# Patient Record
Sex: Female | Born: 1975 | Race: Black or African American | Hispanic: No | Smoking: Former smoker
Health system: Southern US, Community
[De-identification: ages and names within clinical notes are randomized; demographics above are authoritative.]

## PROBLEM LIST (undated history)

## (undated) DIAGNOSIS — F419 Anxiety disorder, unspecified: Secondary | ICD-10-CM

## (undated) DIAGNOSIS — D649 Anemia, unspecified: Secondary | ICD-10-CM

## (undated) DIAGNOSIS — F32A Depression, unspecified: Secondary | ICD-10-CM

## (undated) DIAGNOSIS — K219 Gastro-esophageal reflux disease without esophagitis: Secondary | ICD-10-CM

## (undated) DIAGNOSIS — E119 Type 2 diabetes mellitus without complications: Secondary | ICD-10-CM

## (undated) HISTORY — DX: Depression, unspecified: F32.A

## (undated) HISTORY — DX: Anemia, unspecified: D64.9

## (undated) HISTORY — DX: Anxiety disorder, unspecified: F41.9

## (undated) HISTORY — PX: KNEE ARTHROSCOPY WITH ANTERIOR CRUCIATE LIGAMENT (ACL) REPAIR: SHX5644

## (undated) HISTORY — DX: Gastro-esophageal reflux disease without esophagitis: K21.9

---

## 2002-11-23 DIAGNOSIS — E139 Other specified diabetes mellitus without complications: Secondary | ICD-10-CM | POA: Insufficient documentation

## 2020-02-21 ENCOUNTER — Emergency Department: Payer: No Typology Code available for payment source

## 2020-02-21 ENCOUNTER — Emergency Department
Admission: EM | Admit: 2020-02-21 | Discharge: 2020-02-21 | Disposition: A | Discharge status: Home / Self Care | Payer: No Typology Code available for payment source | Attending: Emergency Medicine | Admitting: Emergency Medicine

## 2020-02-21 ENCOUNTER — Other Ambulatory Visit: Payer: Self-pay

## 2020-02-21 DIAGNOSIS — E119 Type 2 diabetes mellitus without complications: Secondary | ICD-10-CM | POA: Diagnosis not present

## 2020-02-21 DIAGNOSIS — Y999 Unspecified external cause status: Secondary | ICD-10-CM | POA: Insufficient documentation

## 2020-02-21 DIAGNOSIS — S161XXA Strain of muscle, fascia and tendon at neck level, initial encounter: Secondary | ICD-10-CM | POA: Diagnosis not present

## 2020-02-21 DIAGNOSIS — Y93I9 Activity, other involving external motion: Secondary | ICD-10-CM | POA: Insufficient documentation

## 2020-02-21 DIAGNOSIS — S22089A Unspecified fracture of T11-T12 vertebra, initial encounter for closed fracture: Secondary | ICD-10-CM | POA: Insufficient documentation

## 2020-02-21 DIAGNOSIS — F17210 Nicotine dependence, cigarettes, uncomplicated: Secondary | ICD-10-CM | POA: Diagnosis not present

## 2020-02-21 DIAGNOSIS — S299XXA Unspecified injury of thorax, initial encounter: Secondary | ICD-10-CM | POA: Diagnosis present

## 2020-02-21 DIAGNOSIS — Y92410 Unspecified street and highway as the place of occurrence of the external cause: Secondary | ICD-10-CM | POA: Diagnosis not present

## 2020-02-21 DIAGNOSIS — S22009A Unspecified fracture of unspecified thoracic vertebra, initial encounter for closed fracture: Secondary | ICD-10-CM

## 2020-02-21 HISTORY — DX: Type 2 diabetes mellitus without complications: E11.9

## 2020-02-21 MED ORDER — IBUPROFEN 600 MG PO TABS: 600.0000 mg | ORAL_TABLET | Freq: Three times a day (TID) | ORAL | 0 refills | Status: DC | PRN

## 2020-02-21 MED ORDER — CYCLOBENZAPRINE HCL 10 MG PO TABS: 10.0000 mg | ORAL_TABLET | Freq: Three times a day (TID) | ORAL | 0 refills | Status: DC | PRN

## 2020-02-21 MED ORDER — TRAMADOL HCL 50 MG PO TABS: 50.0000 mg | ORAL_TABLET | Freq: Four times a day (QID) | ORAL | 0 refills | Status: AC | PRN
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
## 2020-02-21 NOTE — Discharge Instructions (Signed)
Follow discharge care instruction and follow-up orthopedic choice for definitive evaluation of directed fracture.  Be advised medication may cause drowsiness.

## 2020-02-21 NOTE — ED Notes (Signed)
POC urine pregnancy negative at this time.

## 2020-02-22 LAB — POCT PREGNANCY, URINE: Preg Test, Ur: NEGATIVE

## 2020-04-20 ENCOUNTER — Other Ambulatory Visit: Payer: Self-pay

## 2020-04-20 DIAGNOSIS — F17210 Nicotine dependence, cigarettes, uncomplicated: Secondary | ICD-10-CM | POA: Insufficient documentation

## 2020-04-20 DIAGNOSIS — L723 Sebaceous cyst: Secondary | ICD-10-CM | POA: Insufficient documentation

## 2020-04-20 DIAGNOSIS — E119 Type 2 diabetes mellitus without complications: Secondary | ICD-10-CM | POA: Insufficient documentation

## 2020-04-20 NOTE — ED Triage Notes (Signed)
Pt presents to ED with painful large knot under her left breast. Pt states she noticed a very small knot in the affected area over a year ago but it did not hurt and did not change until approx 2-3 days ago. Pt states she noticed that suddenly it was much larger and very painful. Denies drainage.

## 2020-04-21 ENCOUNTER — Emergency Department: Payer: Self-pay

## 2020-04-21 ENCOUNTER — Encounter: Payer: Self-pay | Admitting: Emergency Medicine

## 2020-04-21 ENCOUNTER — Emergency Department
Admission: EM | Admit: 2020-04-21 | Discharge: 2020-04-21 | Disposition: A | Discharge status: Home / Self Care | Payer: Self-pay | Attending: Emergency Medicine | Admitting: Emergency Medicine

## 2020-04-21 DIAGNOSIS — R52 Pain, unspecified: Secondary | ICD-10-CM

## 2020-04-21 DIAGNOSIS — R609 Edema, unspecified: Secondary | ICD-10-CM

## 2020-04-21 DIAGNOSIS — L089 Local infection of the skin and subcutaneous tissue, unspecified: Secondary | ICD-10-CM

## 2020-04-21 MED ORDER — SULFAMETHOXAZOLE-TRIMETHOPRIM 800-160 MG PO TABS
1.0000 | ORAL_TABLET | Freq: Two times a day (BID) | ORAL | 0 refills | Status: AC
Start: 2020-04-21 — End: 2020-05-01

## 2020-04-21 MED ORDER — OXYCODONE-ACETAMINOPHEN 5-325 MG PO TABS: 1.0000 | ORAL_TABLET | ORAL | 0 refills | Status: AC | PRN

## 2020-04-21 MED ORDER — SULFAMETHOXAZOLE-TRIMETHOPRIM 800-160 MG PO TABS
1.0000 | ORAL_TABLET | Freq: Once | ORAL | Status: AC
  Administered 2020-04-21: 1 via ORAL
  Filled 2020-04-21: qty 1

## 2020-04-21 NOTE — ED Provider Notes (Signed)
[facility] Emergency Department Provider Note  ____________________________________________   First MD Initiated Contact with Patient 04/21/20 [phone]     (approximate)  I have reviewed the triage vital signs and the nursing notes.   HISTORY  Chief Complaint Cyst    HPI Wendy Colon is a 44 y.o. female history diabetes mellitus presents to the emergency department secondary to a painful large knot under her left breast.  Patient states that she had a small knot in that area for well over a year that was never painful however for the past 2 to 3 days area has increased in size and is markedly painful with a current pain score of 9 out of 10.  Patient states that the pain is worse with palpation of the area.  Patient denies any fever afebrile on presentation.        Past Medical History:  Diagnosis Date  . Diabetes mellitus without complication ([place])     There are no problems to display for this patient.   History reviewed. No pertinent surgical history.  Prior to Admission medications   Medication Sig Start Date End Date Taking? Authorizing Provider  cyclobenzaprine (FLEXERIL) 10 MG tablet Take 1 tablet (10 mg total) by mouth 3 (three) times daily as needed. 02/21/20   Sable Feil, PA-C  ibuprofen (ADVIL) 600 MG tablet Take 1 tablet (600 mg total) by mouth every 8 (eight) hours as needed. 02/21/20   Sable Feil, PA-C  oxyCODONE-acetaminophen (PERCOCET) 5-325 MG tablet Take 1 tablet by mouth every 4 (four) hours as needed for severe pain. 04/21/20 04/21/21  Gregor Hams, MD  sulfamethoxazole-trimethoprim (BACTRIM DS) 800-160 MG tablet Take 1 tablet by mouth 2 (two) times daily for 10 days. 04/21/20 05/01/20  Gregor Hams, MD  traMADol (ULTRAM) 50 MG tablet Take 1 tablet (50 mg total) by mouth every 6 (six) hours as needed. 02/21/20 02/20/21  Sable Feil, PA-C    Allergies Patient has no known allergies.  No family history on  file.  Social History Social History   Tobacco Use  . Smoking status: Current Every Day Smoker    Types: Cigarettes  . Smokeless tobacco: Never Used  Vaping Use  . Vaping Use: Never used  Substance Use Topics  . Alcohol use: Yes    Comment: ocassional  . Drug use: Never    Review of Systems Constitutional: No fever/chills Eyes: No visual changes. ENT: No sore throat. Cardiovascular: Denies chest pain. Respiratory: Denies shortness of breath. Gastrointestinal: No abdominal pain.  No nausea, no vomiting.  No diarrhea.  No constipation. Genitourinary: Negative for dysuria. Musculoskeletal: Negative for neck pain.  Negative for back pain. Integumentary: As it is for swollen tender area just inferior to the left breast Neurological: Negative for headaches, focal weakness or numbness.  ____________________________________________   PHYSICAL EXAM:  VITAL SIGNS: ED Triage Vitals  Enc Vitals Group     BP 04/21/20 0002 (!) 144/78     Pulse Rate 04/21/20 0002 (!) 103     Resp 04/21/20 0002 18     Temp 04/21/20 0002 97.6 F (36.4 C)     Temp Source 04/21/20 0002 Oral     SpO2 04/21/20 0002 100 %     Weight 04/21/20 0005 102.1 kg (225 lb)     Height 04/21/20 0005 1.626 m (5\' 4" )     Head Circumference --      Peak Flow --      Pain Score 04/21/20  0004 9     Pain Loc --      Pain Edu? --      Excl. in [place]? --     Constitutional: Alert and oriented.  Eyes: Conjunctivae are normal.  Head: Atraumatic. Mouth/Throat: Patient is wearing a mask. Neck: No stridor.  No meningeal signs.   Chest: Golf ball size area of induration tender to palpation just inferior to the left breast without any overlying skin changes Cardiovascular: Normal rate, regular rhythm. Good peripheral circulation. Grossly normal heart sounds. Respiratory: Normal respiratory effort.  No retractions. Gastrointestinal: Soft and nontender. No distention.  Musculoskeletal: No lower extremity tenderness nor  edema. No gross deformities of extremities. Neurologic:  Normal speech and language. No gross focal neurologic deficits are appreciated.  Skin: Refer to chest exam Psychiatric: Mood and affect are normal. Speech and behavior are normal.  CLINICAL DATA:  Painful nodule along the anterior chest wall inferior to the left breast.  EXAM: ULTRASOUND OF SOFT TISSUES  TECHNIQUE: Ultrasound examination of the head and neck soft tissues was performed in the area of clinical concern in the anterior chest wall soft tissues inferior to the left breast  COMPARISON:  None.  FINDINGS: Corresponding well to the area of palpable abnormality is a heterogeneous though predominantly hypoechoic structure measuring 2.0 x 0.8 x 1.8 cm. This does appear contiguous with the superficial dermal layer. No sinus tract to the skin surface is visualized. Some more anechoic central components are present as well which could reflect fluid within this collection. Posterior acoustic enhancement is noted. Suspect some mild surrounding edematous change with increased vascularity along the periphery which could reflect inflammation. A more crescentic hypoattenuating area versus possible septation seen posteriorly, possibly rupture versus loculation. No concerning internal color Doppler flow.  IMPRESSION: Features are most suggestive of a an epidermal inclusion cyst. A hypoattenuating crescentic area posteriorly could reflect cyst rupture or a septate loculation. Surrounding hyperemic color flow could also suggest more acute inflammation or infection. Consider repeat sonographic imaging or MRI assessment if this structure fails to regress or continues to enlarge following therapeutic intervention.   Electronically Signed   By: Lovena Le M.D.   On: 04/21/2020 06:50 Procedures   ____________________________________________   INITIAL IMPRESSION / MDM / [place] / ED COURSE  As part of  my medical decision making, I reviewed the following data within the electronic MEDICAL RECORD NUMBER  44 year old female presented with above-stated history and physical exam concerning for an infected sebaceous cyst, abscess, tumor.  Soft tissue ultrasound was performed which revealed findings most suggestive of a epidermal cyst.  Patient was given Bactrim DS in the emergency department will be prescribed the same for home.  Patient also prescribed Percocet and advised not to drive or operate machinery while taking Percocet.  Patient referred to Dr. Dahlia Byes general surgery for further outpatient evaluation and management. ____________________________________________  FINAL CLINICAL IMPRESSION(S) / ED DIAGNOSES  Final diagnoses:  Swelling  Pain  Infected sebaceous cyst     MEDICATIONS GIVEN DURING THIS VISIT:  Medications  sulfamethoxazole-trimethoprim (BACTRIM DS) 800-160 MG per tablet 1 tablet (1 tablet Oral Given 04/21/20 0736)     ED Discharge Orders         Ordered    sulfamethoxazole-trimethoprim (BACTRIM DS) 800-160 MG tablet  2 times daily     Discontinue  Reprint     04/21/20 0708    oxyCODONE-acetaminophen (PERCOCET) 5-325 MG tablet  Every 4 hours PRN     Discontinue  Reprint     04/21/20 0708          *Please note:  Harini Dearmond was evaluated in Emergency Department on 04/22/2020 for the symptoms described in the history of present illness. She was evaluated in the context of the global COVID-19 pandemic, which necessitated consideration that the patient might be at risk for infection with the SARS-CoV-2 virus that causes COVID-19. Institutional protocols and algorithms that pertain to the evaluation of patients at risk for COVID-19 are in a state of rapid change based on information released by regulatory bodies including the CDC and federal and state organizations. These policies and algorithms were followed during the patient's care in the ED.  Some ED evaluations and  interventions may be delayed as a result of limited staffing during and after the pandemic.*  Note:  This document was prepared using Dragon voice recognition software and may include unintentional dictation errors.   Gregor Hams, MD 04/22/20 [phone]

## 2020-04-21 NOTE — ED Notes (Signed)
Pt denies any needs at this time. Lights dimmed and call bell in reach.

## 2020-04-23 ENCOUNTER — Telehealth: Payer: Self-pay | Admitting: Surgery

## 2020-04-23 NOTE — Telephone Encounter (Signed)
Outbound call made to pt & msg left requesting a call back to schedule an ED follow up appt for an infected sebaceous cyst w/Dr. Dahlia Byes in 2 wks from 04/20/20.  When the pt returns the call, please schedule.  Thank you

## 2020-04-26 NOTE — Telephone Encounter (Signed)
Outbound call made to the pt who answered & asked to call right back.  This attempt was made around 11:30 am & thus far no returned call has been recv'd or documented.  Thank you

## 2020-04-28 NOTE — Telephone Encounter (Signed)
Outbound call made & appt finally scheduled for 05/24/20.  The pt's work schedule did not allow for any date prior to this.

## 2020-05-24 ENCOUNTER — Ambulatory Visit: Payer: Self-pay | Admitting: Surgery

## 2020-05-31 ENCOUNTER — Encounter: Payer: Self-pay | Admitting: Surgery

## 2020-05-31 ENCOUNTER — Other Ambulatory Visit: Payer: Self-pay

## 2020-05-31 ENCOUNTER — Ambulatory Visit (INDEPENDENT_AMBULATORY_CARE_PROVIDER_SITE_OTHER): Payer: Self-pay | Admitting: Surgery

## 2020-05-31 VITALS — BP 126/78 | HR 89 | Temp 99.8°F | Ht 64.0 in | Wt 222.8 lb

## 2020-05-31 DIAGNOSIS — N6002 Solitary cyst of left breast: Secondary | ICD-10-CM

## 2020-05-31 MED ORDER — FLUCONAZOLE 200 MG PO TABS: 200.0000 mg | ORAL_TABLET | Freq: Every day | ORAL | 0 refills | Status: DC

## 2020-05-31 MED ORDER — AMOXICILLIN-POT CLAVULANATE 875-125 MG PO TABS
1.0000 | ORAL_TABLET | Freq: Two times a day (BID) | ORAL | 0 refills | Status: AC
Start: 2020-05-31 — End: 2020-06-07

## 2020-05-31 NOTE — Patient Instructions (Addendum)
Dr.Pabon suggested patient to have an Mammogram and Ultrasound of Left Breast. Patient will follow up in two weeks with Dr.Pabon. Patient will contact our office when to help scheduling with new job.   Breast Cyst  A breast cyst is a sac in the breast that is filled with fluid. They are usually noncancerous (benign) and are common among women. Breast cysts are most often in the upper, outer portion of the breast. One or more cysts may develop. They form when fluid builds up inside the breast glands. There are several types of breast cysts. Some are too small to feel, but these can be seen with imaging tests such as an X-ray of the breast (mammogram) or ultrasound. Breast cysts do not increase your risk of breast cancer. They usually disappear after you no longer have a menstrual cycle (after menopause), unless you take artificial hormones (are on hormone therapy). What are the causes? This condition may be caused by:  Blockage of tubes (ducts) in the breast glands, which leads to fluid buildup. Duct blockage may result from: ? Fibrocystic breast changes. This is a common, benign condition that occurs when women go through hormonal changes during the menstrual cycle. This is a common cause of multiple breast cysts. ? Overgrowth of breast tissue or breast glands. ? Scar tissue in the breast from previous surgery.  Changes in certain female hormones (estrogen and progesterone). The exact cause of this condition is not known. What increases the risk? You may be more likely to develop breast cysts if you have not gone through menopause. What are the signs or symptoms? Symptoms of this condition include:  Feeling one or more smooth, round, soft lumps (like grapes) in the breast that are easily movable. The lump or lumps may get bigger and more painful before your menstrual period and get smaller after your menstrual period.  Breast discomfort or pain. How is this diagnosed? This condition may  be diagnosed based on:  A physical exam. A cyst can be felt by your health care provider during this exam.  Imaging tests, such as mammogram or ultrasound. Fluid may be removed from the cyst with a needle (fine-needle aspiration) and tested to make sure the cyst is not cancerous. How is this treated? Treatment may not be needed for this condition. Your health care provider may monitor the cyst to see if it goes away on its own. If the cyst is uncomfortable or gets bigger, or if you do not like how the cyst makes your breast look, you may need treatment. Treatment may include:  Hormone therapy.  Fine-needle aspiration to drain fluid from the cyst. There is a chance of the cyst coming back (recurring) after aspiration.  Surgery to remove the cyst. Follow these instructions at home: Self-exams   Do a breast self-exam every month, or as often as directed. A breast self-exam involves: ? Comparing your breasts in the mirror. ? Looking for visible changes in your skin or nipples. ? Feeling for lumps or changes.  Having many breast cysts may make it harder to feel for new lumps. Understand how your breasts normally look and feel, and write down any changes in your breasts. Tell your health care provider about any changes. Eating and drinking  Follow instructions from your health care provider about eating and drinking restrictions.  Drink enough fluid to keep your urine pale yellow.  Avoid caffeine.  Cut down on salt (sodium) in what you eat and drink, especially before your menstrual period.  Too much sodium can cause fluid buildup, breast swelling, and discomfort. General instructions  See your health care provider regularly. ? Get a yearly physical exam. ? If you are 32-32 years old, get a clinical breast exam every 1-3 years. After the age of 10 years, get this exam every year. ? Get mammograms as often as directed.  Take over-the-counter and prescription medicines only as told by  your health care provider.  Wear a supportive bra, especially when exercising.  Keep all follow-up visits as told by your health care provider. This is important. Contact a health care provider if:  You feel, or think you feel, a lump in your breast.  You notice that both breasts look or feel different than usual.  Your breast is still causing pain after your menstrual period is over.  You find new lumps or bumps that were not there before.  You feel lumps in your armpit. Get help right away if:  You have severe pain, tenderness, redness, or warmth in your breast.  You have fluid or blood leaking from your nipple.  Your breast lump becomes hard and painful.  You notice dimpling or wrinkling of the breast or nipple. Summary  A breast cyst is a sac in the breast that is filled with fluid.  Treatment may not be needed for this condition.  If the cyst is uncomfortable or gets bigger, or if you do not like how the cyst makes your breast look, you may need treatment. This information is not intended to replace advice given to you by your health care provider. Make sure you discuss any questions you have with your health care provider. Document Revised: 03/04/2019 Document Reviewed: 03/04/2019 Elsevier Patient Education  [place].

## 2020-06-02 NOTE — Progress Notes (Signed)
Patient ID: Wendy Colon, female   DOB: 1976-02-19, 44 y.o.   MRN: [id number]  HPI Wendy Colon is a 44 y.o. female in consultation at the request of Wendy Colon.  She reports that her last month and a half or so she has had causes significant pain.  She ports that the pain is intermittent mild to moderate intensity and sharp in nature.  Worsening with tight breast and when she presses on the left breast.  No fevers no chills.  She went to the emergency room about 6 weeks ago and at that time an ultrasound was obtained that I have personally reviewed showing evidence of a 2 cm lesions consistent with epidermal cyst on the left breast. She was given Bactrim and she had significant improvement.  Denies any fevers any chills.  No family history of breast cancer.  She had a mammogram before about 2 years ago at [facility].  Per her report there was no evidence of any concerning lesions.  Her CBC shows anemia with a hemoglobin of 10.7 and a sugar of 278    HPI  Past Medical History:  Diagnosis Date  . Diabetes mellitus without complication ([place])     History reviewed. No pertinent surgical history.  Family History  Problem Relation Age of Onset  . Diabetes Mother   . Hypertension Mother   . Diabetes Father   . Hypertension Father   . Diabetes Sister   . Diabetes Sister   . Diabetes Sister     Social History Social History   Tobacco Use  . Smoking status: Current Every Day Smoker    Types: Cigarettes  . Smokeless tobacco: Never Used  Vaping Use  . Vaping Use: Never used  Substance Use Topics  . Alcohol use: Yes    Comment: ocassional  . Drug use: Never    No Known Allergies  Current Outpatient Medications  Medication Sig Dispense Refill  . buPROPion (WELLBUTRIN XL) 150 MG 24 hr tablet Take 150 mg by mouth every morning.    Marland Kitchen DEXILANT 30 MG capsule Take 1 capsule by mouth daily.    Marland Kitchen glimepiride (AMARYL) 2 MG tablet Take 2 mg by mouth daily.    Marland Kitchen ibuprofen (ADVIL) 600  MG tablet Take 1 tablet (600 mg total) by mouth every 8 (eight) hours as needed. 15 tablet 0  . amoxicillin-clavulanate (AUGMENTIN) 875-125 MG tablet Take 1 tablet by mouth 2 (two) times daily for 7 days. 14 tablet 0  . cyclobenzaprine (FLEXERIL) 10 MG tablet Take 1 tablet (10 mg total) by mouth 3 (three) times daily as needed. (Patient not taking: Reported on 05/31/2020) 15 tablet 0  . fluconazole (DIFLUCAN) 200 MG tablet Take 1 tablet (200 mg total) by mouth daily. 2 tablet 0  . oxyCODONE-acetaminophen (PERCOCET) 5-325 MG tablet Take 1 tablet by mouth every 4 (four) hours as needed for severe pain. (Patient not taking: Reported on 05/31/2020) 20 tablet 0  . traMADol (ULTRAM) 50 MG tablet Take 1 tablet (50 mg total) by mouth every 6 (six) hours as needed. (Patient not taking: Reported on 05/31/2020) 20 tablet 0   No current facility-administered medications for this visit.     Review of Systems Full ROS  was asked and was negative except for the information on the HPI  Physical Exam Blood pressure 126/78, pulse 89, temperature 99.8 F (37.7 C), temperature source Oral, height 5\' 4"  (1.626 m), weight 222 lb 12.8 oz (101.1 kg), SpO2 96 %. CONSTITUTIONAL: NAD EYES: Pupils  are equal, round, and reactive to light, Sclera are non-icteric. EARS, NOSE, MOUTH AND THROAT: She is wearing a mask hearing is intact to voice. LYMPH NODES:  Lymph nodes in the neck are normal. RESPIRATORY:  Lungs are clear. There is normal respiratory effort, with equal breath sounds bilaterally, and without pathologic use of accessory muscles. CARDIOVASCULAR: Heart is regular without murmurs, gallops, or rubs. BREAST: There is evidence of tenderness palpation on the left breast.  At the inframammary fold located approximately 6:00.  There is some induration but is difficult to determine whether or not this is a cyst or mass.  Exam is limited due to tenderness. GI: The abdomen is  soft, nontender, and nondistended. There are no  palpable masses. There is no hepatosplenomegaly. There are normal bowel sounds in all quadrants. GU: Rectal deferred.   MUSCULOSKELETAL: Normal muscle strength and tone. No cyanosis or edema.   SKIN: Turgor is good and there are no pathologic skin lesions or ulcers. NEUROLOGIC: Motor and sensation is grossly normal. Cranial nerves are grossly intact. PSYCH:  Oriented to person, place and time. Affect is normal.  Data Reviewed  I have personally reviewed the patient's imaging, laboratory findings and medical records.    Assessment/Plan 44 year old female with benign left breast epidermal inclusion cyst.  Cussed with the patient in detail given that the ultrasound was done about 6 weeks ago I do think before proceeding with any surgical intervention I would like to obtain a formal mammogram as well as an ultrasound to complement the study.  She will likely benefit from excisional biopsy of the breast benign soft tissue mass.  Discussed with the patient in detail.  Currently there is no need for emergent surgical intervention at this time.  We will see her back after she completes her work-up. A copy of this report was sent to the referring provider    Caroleen Hamman, MD FACS General Surgeon 06/02/2020, 6:42 PM

## 2020-06-10 ENCOUNTER — Telehealth: Payer: Self-pay | Admitting: Emergency Medicine

## 2020-06-10 NOTE — Telephone Encounter (Signed)
Per Destiny, patient was starting a new job and wanted to wait on scheduling mammogram and ultrasound. I tried contacting patient with no answer. Left vm to call the office back to see if we can proceed with scheduling with imaging.

## 2020-06-10 NOTE — Telephone Encounter (Signed)
-----   Message from Olean Ree, MD sent at 06/10/2020  7:33 AM EDT ----- Regarding: U/S and mammogram Hi,  [place] saw this patient on 8/2 and both U/S and mammogram were ordered for her that day, but looks like they have not been scheduled yet.  Can y'all check with [place] to make sure it's not been missed?  Thanks!  Jose  ----- Message ----- From: SYSTEM Sent: 06/05/2020  12:09 AM EDT To: Jules Husbands, MD, Amb Clinical Pool

## 2020-08-10 ENCOUNTER — Ambulatory Visit: Payer: Self-pay | Admitting: Internal Medicine

## 2021-11-24 ENCOUNTER — Encounter: Payer: Self-pay | Admitting: Nurse Practitioner

## 2021-11-24 ENCOUNTER — Other Ambulatory Visit: Payer: Self-pay

## 2021-11-24 ENCOUNTER — Ambulatory Visit (INDEPENDENT_AMBULATORY_CARE_PROVIDER_SITE_OTHER): Payer: No Typology Code available for payment source | Admitting: Nurse Practitioner

## 2021-11-24 VITALS — BP 142/91 | HR 98 | Temp 98.0°F | Ht 64.0 in | Wt 216.0 lb

## 2021-11-24 DIAGNOSIS — E669 Obesity, unspecified: Secondary | ICD-10-CM | POA: Diagnosis not present

## 2021-11-24 DIAGNOSIS — Z1329 Encounter for screening for other suspected endocrine disorder: Secondary | ICD-10-CM

## 2021-11-24 DIAGNOSIS — Z794 Long term (current) use of insulin: Secondary | ICD-10-CM

## 2021-11-24 DIAGNOSIS — R5383 Other fatigue: Secondary | ICD-10-CM

## 2021-11-24 DIAGNOSIS — N926 Irregular menstruation, unspecified: Secondary | ICD-10-CM

## 2021-11-24 DIAGNOSIS — F331 Major depressive disorder, recurrent, moderate: Secondary | ICD-10-CM

## 2021-11-24 DIAGNOSIS — Z6835 Body mass index (BMI) 35.0-35.9, adult: Secondary | ICD-10-CM

## 2021-11-24 DIAGNOSIS — E1165 Type 2 diabetes mellitus with hyperglycemia: Secondary | ICD-10-CM | POA: Diagnosis not present

## 2021-11-24 DIAGNOSIS — Z13220 Encounter for screening for lipoid disorders: Secondary | ICD-10-CM

## 2021-11-24 DIAGNOSIS — Z1231 Encounter for screening mammogram for malignant neoplasm of breast: Secondary | ICD-10-CM | POA: Diagnosis not present

## 2021-11-24 MED ORDER — FREESTYLE LIBRE 14 DAY READER DEVI: 6 refills | Status: DC

## 2021-11-24 MED ORDER — LEVEMIR FLEXTOUCH 100 UNIT/ML ~~LOC~~ SOPN: 10.0000 [IU] | PEN_INJECTOR | Freq: Every day | SUBCUTANEOUS | 11 refills | Status: DC

## 2021-11-24 MED ORDER — BLOOD GLUCOSE MONITOR KIT: PACK | 0 refills | Status: DC

## 2021-11-24 MED ORDER — FREESTYLE LIBRE 3 SENSOR MISC: 1.0000 | Freq: Four times a day (QID) | 0 refills | Status: DC

## 2021-11-24 NOTE — Progress Notes (Addendum)
Subjective:  Patient ID: Wendy Colon, female    DOB: [birth year]  Age: 46 y.o. MRN: [id number]  CC:  Chief Complaint  Patient presents with   Establish Care   Diabetes   Depression      HPI  This patient arrives today for the above.  Establish care: She is here to establish care.  Main concerns today are diabetes and depression.  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  Read below for further information.  She does mention she has been having irregular periods and would like to be referred to OB/GYN.  She also tells me she is due for mammogram.  Diabetes: She reports a history of type 2 diabetes.  In her chart it states that she is type 1.5 but manages type II.  She is on insulin currently.  She tells me she takes NovoLog and use a sliding scale, however she does not have a specific scale she follows.  She just takes 2 to 10 units based on how she feels and based on her food intake.  She has taken Levemir in the past but is no longer taking this now.  She is interested in trying medication like Ozempic to help control her diabetes but also help with weight loss.  He is also on glimepiride.  She denies any recent hypoglycemic events, but does tell me these occur sometimes.  She reports that she is very sensitive to insulin and that 2 units will often drop her blood sugar from the 230s to low 100s.  Depression: She has a history of depression and anxiety and was on Wellbutrin in the past.  She tolerated this well, she is not currently on any medication but feels that she needs something for her mood.  She does believe the Wellbutrin increased anxiety slightly when she was on this in the past.  She is tried Cymbalta as well which caused sexual dysfunction.  She would prefer not to restart a medication like Cymbalta or any medication that has side effect of sexual dysfunction.  Past  Medical History:  Diagnosis Date   Anxiety 2018   Diabetes mellitus without complication ([place])    GERD (gastroesophageal reflux disease) 2004      Family History  Problem Relation Age of Onset   Diabetes Mother    Hypertension Mother    Diabetes Father    Hypertension Father    Diabetes Sister    Diabetes Sister    Diabetes Sister     Social History   Social History Narrative   Not on file   Social History   Tobacco Use   Smoking status: Every Day    Types: Cigarettes   Smokeless tobacco: Never  Substance Use Topics   Alcohol use: Yes    Comment: ocassional     Current Meds  Medication Sig   blood glucose meter kit and supplies KIT Dispense based on patient and insurance preference. Use up to four times daily as directed.   Continuous Blood Gluc Receiver (FREESTYLE LIBRE 14 DAY READER) DEVI Use to check blood sugar four times a day   Continuous Blood Gluc Sensor (FREESTYLE LIBRE 3 SENSOR) MISC 1 each by Does not apply route in the morning, at noon, in the evening, and at bedtime.   DEXILANT 30 MG capsule Take 1 capsule by mouth daily.   glimepiride (AMARYL) 2 MG  tablet Take 2 mg by mouth daily.   insulin detemir (LEVEMIR FLEXTOUCH) 100 UNIT/ML FlexPen Inject 10 Units into the skin daily.   losartan (COZAAR) 50 MG tablet Take 25 mg by mouth daily.   [DISCONTINUED] insulin aspart (NOVOLOG) 100 UNIT/ML injection Inject 2-10 Units into the skin 3 (three) times daily before meals. Sliding Scale    ROS:  Review of Systems  Constitutional:  Positive for malaise/fatigue.  Respiratory:  Negative for shortness of breath.   Cardiovascular:  Negative for chest pain.  Neurological:  Negative for loss of consciousness.  Psychiatric/Behavioral:  Positive for depression. The patient is nervous/anxious.     Objective:   Today's Vitals: BP (!) 142/91 (BP Location: Left Arm, Patient Position: Sitting, Cuff Size: Normal)    Pulse 98    Temp 98 F (36.7 C) (Oral)    Ht 5' 4"   (1.626 m)    Wt 216 lb (98 kg)    SpO2 100%    BMI 37.08 kg/m  Vitals with BMI 11/24/2021 05/31/2020 04/21/2020  Height 5' 4"  5' 4"  -  Weight 216 lbs 222 lbs 13 oz -  BMI 41.28 78.67 -  Systolic 672 094 709  Diastolic 91 78 72  Pulse 98 89 87     Physical Exam Vitals reviewed.  Constitutional:      General: She is not in acute distress.    Appearance: Normal appearance.  HENT:     Head: Normocephalic and atraumatic.  Neck:     Vascular: No carotid bruit.  Cardiovascular:     Rate and Rhythm: Normal rate and regular rhythm.     Pulses: Normal pulses.     Heart sounds: Normal heart sounds.  Pulmonary:     Effort: Pulmonary effort is normal.     Breath sounds: Normal breath sounds.  Skin:    General: Skin is warm and dry.  Neurological:     General: No focal deficit present.     Mental Status: She is alert and oriented to person, place, and time.  Psychiatric:        Mood and Affect: Mood normal.        Behavior: Behavior normal.        Judgment: Judgment normal.         Assessment and Plan   1. Type 2 diabetes mellitus with hyperglycemia, with long-term current use of insulin ([place])   2. Irregular periods   3. Encounter for screening mammogram for malignant neoplasm of breast   4. Class 2 obesity without serious comorbidity with body mass index (BMI) of 35.0 to 35.9 in adult, unspecified obesity type   5. Screening for lipid disorders   6. Screening for thyroid disorder   7. Fatigue, unspecified type   8. Moderate episode of recurrent major depressive disorder ([place])      Plan: 1.,  4.-7.  We will check blood work for further evaluation today.  We will start her on 10 units of Levemir that she will take once a day.  Also recommend she check her fasting blood sugar every day and keep a log of this.  We did discusshow to titrate up on her Levemir based on her fasting blood sugars at home.  She will follow-up with me in 2 weeks so we can monitor this closely.  We will  also refer to endocrinology for further evaluation and assistance with managing her diabetes to determine if she is type 1.5 or type II.  We will check A1c  today and check urine for albuminuria.  She is currently on losartan, and I encouraged her to continue taking this.  She does mention it sometimes makes her dizzy so we will discuss this further at next office visit and may adjust the dose. 2.  We will refer to OB/GYN for further evaluation of her irregular periods. 3.  We will order screening mammogram. 8.  We did discuss different treatment options.  For now because she wants to avoid medications that can cause sexual dysfunction we will recommend trying Wellbutrin again and may be adding BuSpar that she can take as needed for anxiety.  She is agreeable to this plan, will consider ordering these prescriptions once we get blood work back tomorrow.   Tests ordered Orders Placed This Encounter  Procedures   MM Digital Screening   TSH   Hemoglobin A1c   Lipid panel   Comprehensive metabolic panel   CBC with Differential/Platelet   T3, free   T4, free   Microalbumin / creatinine urine ratio   Ambulatory referral to Endocrinology   Ambulatory referral to Obstetrics / Gynecology      Meds ordered this encounter  Medications   blood glucose meter kit and supplies KIT    Sig: Dispense based on patient and insurance preference. Use up to four times daily as directed.    Dispense:  1 each    Refill:  0    Order Specific Question:   Supervising Provider    Answer:   Binnie Rail [[id number]]    Order Specific Question:   Number of strips    Answer:   100    Order Specific Question:   Number of lancets    Answer:   100   Continuous Blood Gluc Receiver (FREESTYLE LIBRE 14 DAY READER) DEVI    Sig: Use to check blood sugar four times a day    Dispense:  1 each    Refill:  6    Order Specific Question:   Supervising Provider    Answer:   BURNS, Claudina Lick [[id number]]   Continuous Blood Gluc  Sensor (FREESTYLE LIBRE 3 SENSOR) MISC    Sig: 1 each by Does not apply route in the morning, at noon, in the evening, and at bedtime.    Dispense:  1 each    Refill:  0    Order Specific Question:   Supervising Provider    Answer:   BURNS, Claudina Lick [[id number]]   insulin detemir (LEVEMIR FLEXTOUCH) 100 UNIT/ML FlexPen    Sig: Inject 10 Units into the skin daily.    Dispense:  15 mL    Refill:  11    Order Specific Question:   Supervising Provider    Answer:   Binnie Rail [[id number]]    Patient to follow-up in 2 weeks or sooner as needed.  Ailene Ards, NP

## 2021-11-24 NOTE — Patient Instructions (Signed)
Check fasting blood sugar every morning, if fasting blood sugar is >130 over the next 3 days then increase your levemir to 12 units/day. Take that dose for 3 days, if after 3 days your fasting blood sugar is still >130 increase your levemir to 14 units/day. Reach out to me if your fasting blood sugar remains >250 despite being on 14 units of levemir.

## 2021-11-25 ENCOUNTER — Other Ambulatory Visit (INDEPENDENT_AMBULATORY_CARE_PROVIDER_SITE_OTHER): Payer: No Typology Code available for payment source

## 2021-11-25 DIAGNOSIS — Z1231 Encounter for screening mammogram for malignant neoplasm of breast: Secondary | ICD-10-CM | POA: Diagnosis not present

## 2021-11-25 DIAGNOSIS — R5383 Other fatigue: Secondary | ICD-10-CM

## 2021-11-25 DIAGNOSIS — N926 Irregular menstruation, unspecified: Secondary | ICD-10-CM

## 2021-11-25 DIAGNOSIS — E1165 Type 2 diabetes mellitus with hyperglycemia: Secondary | ICD-10-CM

## 2021-11-25 DIAGNOSIS — Z6835 Body mass index (BMI) 35.0-35.9, adult: Secondary | ICD-10-CM | POA: Diagnosis not present

## 2021-11-25 DIAGNOSIS — Z13220 Encounter for screening for lipoid disorders: Secondary | ICD-10-CM

## 2021-11-25 DIAGNOSIS — Z1329 Encounter for screening for other suspected endocrine disorder: Secondary | ICD-10-CM

## 2021-11-25 DIAGNOSIS — E669 Obesity, unspecified: Secondary | ICD-10-CM

## 2021-11-25 DIAGNOSIS — Z794 Long term (current) use of insulin: Secondary | ICD-10-CM

## 2021-11-25 LAB — COMPREHENSIVE METABOLIC PANEL
ALT: 14 U/L (ref 0–35)
AST: 17 U/L (ref 0–37)
Albumin: 4.5 g/dL (ref 3.5–5.2)
Alkaline Phosphatase: 67 U/L (ref 39–117)
BUN: 12 mg/dL (ref 6–23)
CO2: 27 mEq/L (ref 19–32)
Calcium: 9.7 mg/dL (ref 8.4–10.5)
Chloride: 104 mEq/L (ref 96–112)
Creatinine, Ser: 0.96 mg/dL (ref 0.40–1.20)
GFR: 70.67 mL/min (ref >60.00)
Glucose, Bld: 134 mg/dL — ABNORMAL HIGH (ref 70–99)
Potassium: 4.1 mEq/L (ref 3.5–5.1)
Sodium: 137 mEq/L (ref 135–145)
Total Bilirubin: 0.2 mg/dL (ref 0.2–1.2)
Total Protein: 7.7 g/dL (ref 6.0–8.3)

## 2021-11-25 LAB — CBC WITH DIFFERENTIAL/PLATELET
Basophils Absolute: 0.1 10*3/uL (ref 0.0–0.1)
Basophils Relative: 1.4 % (ref 0.0–3.0)
Eosinophils Absolute: 0.1 10*3/uL (ref 0.0–0.7)
Eosinophils Relative: 1.3 % (ref 0.0–5.0)
HCT: 34.8 % — ABNORMAL LOW (ref 36.0–46.0)
Hemoglobin: 11.2 g/dL — ABNORMAL LOW (ref 12.0–15.0)
Lymphocytes Relative: 28 % (ref 12.0–46.0)
Lymphs Abs: 2.3 10*3/uL (ref 0.7–4.0)
MCHC: 32.2 g/dL (ref 30.0–36.0)
MCV: 80.9 fl (ref 78.0–100.0)
Monocytes Absolute: 0.6 10*3/uL (ref 0.1–1.0)
Monocytes Relative: 7.5 % (ref 3.0–12.0)
Neutro Abs: 5 10*3/uL (ref 1.4–7.7)
Neutrophils Relative %: 61.8 % (ref 43.0–77.0)
Platelets: 315 10*3/uL (ref 150.0–400.0)
RBC: 4.3 Mil/uL (ref 3.87–5.11)
RDW: 19.6 % — ABNORMAL HIGH (ref 11.5–15.5)
WBC: 8.1 10*3/uL (ref 4.0–10.5)

## 2021-11-25 LAB — LIPID PANEL
Cholesterol: 166 mg/dL (ref 0–200)
HDL: 69.2 mg/dL (ref >39.00)
LDL Cholesterol: 84 mg/dL (ref 0–99)
NonHDL: 97.26
Total CHOL/HDL Ratio: 2
Triglycerides: 64 mg/dL (ref 0.0–149.0)
VLDL: 12.8 mg/dL (ref 0.0–40.0)

## 2021-11-25 LAB — TSH: TSH: 1.25 u[IU]/mL (ref 0.35–5.50)

## 2021-11-25 LAB — MICROALBUMIN / CREATININE URINE RATIO
Creatinine,U: 114.6 mg/dL
Microalb Creat Ratio: 2.9 mg/g (ref 0.0–30.0)
Microalb, Ur: 3.3 mg/dL — ABNORMAL HIGH (ref 0.0–1.9)

## 2021-11-25 LAB — T3, FREE: T3, Free: 2.9 pg/mL (ref 2.3–4.2)

## 2021-11-25 LAB — T4, FREE: Free T4: 0.79 ng/dL (ref 0.60–1.60)

## 2021-11-28 LAB — HEMOGLOBIN A1C: Hgb A1c MFr Bld: 8.3 % — ABNORMAL HIGH (ref 4.6–6.5)

## 2021-12-01 ENCOUNTER — Other Ambulatory Visit: Payer: Self-pay | Admitting: Nurse Practitioner

## 2021-12-01 ENCOUNTER — Encounter: Payer: Self-pay | Admitting: Nurse Practitioner

## 2021-12-01 DIAGNOSIS — F331 Major depressive disorder, recurrent, moderate: Secondary | ICD-10-CM

## 2021-12-01 DIAGNOSIS — K219 Gastro-esophageal reflux disease without esophagitis: Secondary | ICD-10-CM

## 2021-12-01 DIAGNOSIS — F419 Anxiety disorder, unspecified: Secondary | ICD-10-CM

## 2021-12-01 MED ORDER — BUPROPION HCL ER (XL) 150 MG PO TB24: 150.0000 mg | ORAL_TABLET | Freq: Every day | ORAL | 0 refills | Status: DC

## 2021-12-01 MED ORDER — DEXILANT 30 MG PO CPDR: 1.0000 | DELAYED_RELEASE_CAPSULE | Freq: Every day | ORAL | 0 refills | Status: DC

## 2021-12-01 MED ORDER — BUSPIRONE HCL 5 MG PO TABS: 5.0000 mg | ORAL_TABLET | Freq: Two times a day (BID) | ORAL | 2 refills | Status: DC | PRN

## 2021-12-08 ENCOUNTER — Ambulatory Visit: Payer: Self-pay | Admitting: Nurse Practitioner

## 2021-12-09 ENCOUNTER — Ambulatory Visit (INDEPENDENT_AMBULATORY_CARE_PROVIDER_SITE_OTHER): Payer: No Typology Code available for payment source | Admitting: Nurse Practitioner

## 2021-12-09 ENCOUNTER — Other Ambulatory Visit: Payer: Self-pay

## 2021-12-09 VITALS — BP 140/64 | HR 100 | Temp 98.4°F | Ht 64.0 in | Wt 213.6 lb

## 2021-12-09 DIAGNOSIS — E1165 Type 2 diabetes mellitus with hyperglycemia: Secondary | ICD-10-CM

## 2021-12-09 DIAGNOSIS — F331 Major depressive disorder, recurrent, moderate: Secondary | ICD-10-CM

## 2021-12-09 DIAGNOSIS — Z794 Long term (current) use of insulin: Secondary | ICD-10-CM

## 2021-12-09 DIAGNOSIS — K219 Gastro-esophageal reflux disease without esophagitis: Secondary | ICD-10-CM

## 2021-12-09 DIAGNOSIS — F419 Anxiety disorder, unspecified: Secondary | ICD-10-CM | POA: Diagnosis not present

## 2021-12-09 DIAGNOSIS — D649 Anemia, unspecified: Secondary | ICD-10-CM

## 2021-12-09 DIAGNOSIS — I10 Essential (primary) hypertension: Secondary | ICD-10-CM

## 2021-12-09 MED ORDER — FREESTYLE LIBRE 14 DAY READER DEVI: 6 refills | Status: DC

## 2021-12-09 MED ORDER — LOSARTAN POTASSIUM 25 MG PO TABS: 25.0000 mg | ORAL_TABLET | Freq: Every day | ORAL | 0 refills | Status: DC

## 2021-12-09 MED ORDER — DEXILANT 30 MG PO CPDR: 1.0000 | DELAYED_RELEASE_CAPSULE | Freq: Every day | ORAL | 1 refills | Status: DC

## 2021-12-09 MED ORDER — GLIMEPIRIDE 2 MG PO TABS: 2.0000 mg | ORAL_TABLET | Freq: Every day | ORAL | 1 refills | Status: DC

## 2021-12-09 NOTE — Patient Instructions (Signed)
[facility] [phone] 

## 2021-12-09 NOTE — Progress Notes (Signed)
Subjective:  Patient ID: Wendy Colon, female    DOB: 1975/12/22  Age: 46 y.o. MRN: [id number]  CC:  Chief Complaint  Patient presents with   Follow-up    No concerns. Plans to go over lab work.      HPI  This patient arrives today for the above.  She establish care at this office at her last visit.  Today she tell me she feels well.   Diabetes: We made some medication adjustments and started her on Levemir 10 units injected nightly.  She also continues on glimepiride 2 mg by mouth daily.  She was also referred to endocrinology and has not yet heard about getting this appointment scheduled.  She tells me that her fasting blood sugars are much improved since last office visit and they are around 130.  I had prescribed a freestyle libre, but she was unable to get the reading device so she has not been able to check her blood sugars consistently.  She tells me she does have a hypoglycemic awareness (feels jittery and hyper) and denies any hypoglycemic events.  She can also tell when she is hyperglycemic (feels fatigued ) and denies any symptoms of hyperglycemia.  She continues on losartan, she is not on statin therapy and would prefer to stay off this for now if possible.  Last A1c was 8.3.  Anemia: Blood work done at last office visit which showed normocytic anemia.  She is perimenopausal and tells me her periods are sometimes heavy and sometimes light.  She denies seeing any blood in her stool.  She has not had colon cancer screening.  Depression/anxiety: She continues on Wellbutrin and BuSpar and is tolerating them well.  She is wondering if she could increase her dose of Wellbutrin.  GERD: She continues on Dexilant and is requesting refill of this today.  Hypertension: She continues on losartan and is tolerating this well.  Past Medical History:  Diagnosis Date   Anxiety 2018   Diabetes mellitus without complication ([place])    GERD (gastroesophageal reflux disease) 2004       Family History  Problem Relation Age of Onset   Diabetes Mother    Hypertension Mother    Diabetes Father    Hypertension Father    Diabetes Sister    Diabetes Sister    Diabetes Sister     Social History   Social History Narrative   Not on file   Social History   Tobacco Use   Smoking status: Every Day    Types: Cigarettes   Smokeless tobacco: Never  Substance Use Topics   Alcohol use: Yes    Comment: ocassional     Current Meds  Medication Sig   blood glucose meter kit and supplies KIT Dispense based on patient and insurance preference. Use up to four times daily as directed.   buPROPion (WELLBUTRIN XL) 150 MG 24 hr tablet Take 1 tablet (150 mg total) by mouth daily.   busPIRone (BUSPAR) 5 MG tablet Take 1 tablet (5 mg total) by mouth 2 (two) times daily as needed (anxiety).   Continuous Blood Gluc Sensor (FREESTYLE LIBRE 3 SENSOR) MISC 1 each by Does not apply route in the morning, at noon, in the evening, and at bedtime.   insulin detemir (LEVEMIR FLEXTOUCH) 100 UNIT/ML FlexPen Inject 10 Units into the skin daily.   [DISCONTINUED] Continuous Blood Gluc Receiver (FREESTYLE LIBRE 14 DAY READER) DEVI Use to check blood sugar four times a day   [  DISCONTINUED] DEXILANT 30 MG capsule DR Take 1 capsule (30 mg total) by mouth daily.   [DISCONTINUED] glimepiride (AMARYL) 2 MG tablet Take 2 mg by mouth daily.   [DISCONTINUED] losartan (COZAAR) 50 MG tablet Take 25 mg by mouth daily.    ROS:  Review of Systems  Constitutional:  Negative for fever.  Respiratory:  Negative for shortness of breath.   Cardiovascular:  Negative for chest pain.  Neurological:  Negative for dizziness and headaches.    Objective:   Today's Vitals: BP 140/64 (BP Location: Left Arm, Patient Position: Sitting, Cuff Size: Large)    Pulse 100    Temp 98.4 F (36.9 C) (Oral)    Ht _0  (1.626 m)    Wt 213 lb 9.6 oz (96.9 kg)    LMP  (LMP Unknown)    SpO2 100%    BMI 36.66 kg/m  Vitals with  BMI 12/09/2021 11/24/2021 05/31/2020  Height _1  _2  _3   Weight 213 lbs 10 oz 216 lbs 222 lbs 13 oz  BMI 36.65 25.95 63.87  Systolic 564 332 951  Diastolic 64 91 78  Pulse 884 98 89     Physical Exam Vitals reviewed.  Constitutional:      General: She is not in acute distress.    Appearance: Normal appearance.  HENT:     Head: Normocephalic and atraumatic.  Neck:     Vascular: No carotid bruit.  Cardiovascular:     Rate and Rhythm: Normal rate and regular rhythm.     Pulses: Normal pulses.     Heart sounds: Normal heart sounds.  Pulmonary:     Effort: Pulmonary effort is normal.     Breath sounds: Normal breath sounds.  Skin:    General: Skin is warm and dry.  Neurological:     General: No focal deficit present.     Mental Status: She is alert and oriented to person, place, and time.  Psychiatric:        Mood and Affect: Mood normal.        Behavior: Behavior normal.        Judgment: Judgment normal.         Assessment and Plan   1. Hypertension, unspecified type   2. Type 2 diabetes mellitus with hyperglycemia, with long-term current use of insulin ([facility])   3. Moderate episode of recurrent major depressive disorder ([place])   4. Anxiety   5. Gastroesophageal reflux disease, unspecified whether esophagitis present   6. Anemia, unspecified type      Plan: 1.  She will continue on losartan as currently prescribed, blood pressure slightly above goal today.  But she would prefer to monitor this as opposed to increasing dose today.  May consider dosage adjustment if blood pressure remains elevated.  She was encouraged to monitor blood pressure at home and she has the ability to do, and she is agreeable to this. 2.  She will continue on Levemir for now at 10 units per night and will continue on glimepiride.  She will see endocrinology once they call her, but she was also provided the phone number so she could call them and try to get an appointment scheduled as well.  I  reordered the freestyle Elenor Legato so hopefully she can get the reading device and start consistently monitoring blood sugar.  She will bring her logs to next office visit.  She would like to try Ozempic or another diabetic agent as not insulin if possible, I  would like endocrinology to determine if she has any autoimmune component to her diabetes and if so if she can be a candidate for these medications before I prescribe them.  Thus will defer to endocrinology for this decision. 3., 4.  I told her she could trial Wellbutrin at 300 mg per mouth daily, and to let me know how she tolerates this.  If she feels better on this dose we can make this adjustment next time we prescribed the medication.  She will continue taking BuSpar as needed. 5.  She will continue on Dexilant as prescribed, refill ordered today. 6.  RDW was elevated so we will check iron levels as well as folate and vitamin B12.  Further recommendations will be made based upon these results.  May consider referral to GI or OB/GYN pending results.   Tests ordered No orders of the defined types were placed in this encounter.     Meds ordered this encounter  Medications   DISCONTD: Continuous Blood Gluc Receiver (FREESTYLE LIBRE 14 DAY READER) DEVI    Sig: Use to check blood sugar four times a day    Dispense:  1 each    Refill:  6    Order Specific Question:   Supervising Provider    Answer:   BURNS, Claudina Lick [[id number]]   Continuous Blood Gluc Receiver (FREESTYLE LIBRE 14 DAY READER) DEVI    Sig: Use to check blood sugar four times a day    Dispense:  1 each    Refill:  6    Order Specific Question:   Supervising Provider    Answer:   BURNS, Claudina Lick [[id number]]   glimepiride (AMARYL) 2 MG tablet    Sig: Take 1 tablet (2 mg total) by mouth daily.    Dispense:  90 tablet    Refill:  1    Order Specific Question:   Supervising Provider    Answer:   BURNS, STACY J [[id number]]   DEXILANT 30 MG capsule DR    Sig: Take 1 capsule (30 mg  total) by mouth daily.    Dispense:  90 capsule    Refill:  1    Order Specific Question:   Supervising Provider    Answer:   BURNS, Claudina Lick [[id number]]   losartan (COZAAR) 25 MG tablet    Sig: Take 1 tablet (25 mg total) by mouth daily.    Dispense:  90 tablet    Refill:  0    Order Specific Question:   Supervising Provider    Answer:   Binnie Rail [id number]    Patient to follow-up in 1 month or sooner as needed.  Ailene Ards, NP

## 2021-12-19 ENCOUNTER — Other Ambulatory Visit (INDEPENDENT_AMBULATORY_CARE_PROVIDER_SITE_OTHER): Payer: No Typology Code available for payment source

## 2021-12-19 ENCOUNTER — Other Ambulatory Visit: Payer: Self-pay

## 2021-12-19 DIAGNOSIS — K219 Gastro-esophageal reflux disease without esophagitis: Secondary | ICD-10-CM

## 2021-12-19 DIAGNOSIS — F331 Major depressive disorder, recurrent, moderate: Secondary | ICD-10-CM | POA: Diagnosis not present

## 2021-12-19 DIAGNOSIS — E1165 Type 2 diabetes mellitus with hyperglycemia: Secondary | ICD-10-CM | POA: Diagnosis not present

## 2021-12-19 DIAGNOSIS — F419 Anxiety disorder, unspecified: Secondary | ICD-10-CM

## 2021-12-19 DIAGNOSIS — D649 Anemia, unspecified: Secondary | ICD-10-CM

## 2021-12-19 DIAGNOSIS — Z794 Long term (current) use of insulin: Secondary | ICD-10-CM

## 2021-12-19 DIAGNOSIS — I10 Essential (primary) hypertension: Secondary | ICD-10-CM

## 2021-12-20 LAB — IBC + FERRITIN
Ferritin: 6.6 ng/mL — ABNORMAL LOW (ref 10.0–291.0)
Iron: 82 ug/dL (ref 42–145)
Saturation Ratios: 16.9 % — ABNORMAL LOW (ref 20.0–50.0)
TIBC: 484.4 ug/dL — ABNORMAL HIGH (ref 250.0–450.0)
Transferrin: 346 mg/dL (ref 212.0–360.0)

## 2021-12-20 LAB — VITAMIN B12: Vitamin B-12: 372 pg/mL (ref 211–911)

## 2021-12-20 LAB — FOLATE: Folate: >24.2 ng/mL (ref >5.9)

## 2021-12-22 ENCOUNTER — Other Ambulatory Visit: Payer: Self-pay | Admitting: Nurse Practitioner

## 2021-12-22 DIAGNOSIS — D509 Iron deficiency anemia, unspecified: Secondary | ICD-10-CM

## 2022-01-20 ENCOUNTER — Ambulatory Visit: Payer: No Typology Code available for payment source | Admitting: Nurse Practitioner

## 2022-01-27 ENCOUNTER — Other Ambulatory Visit: Payer: Self-pay | Admitting: Nurse Practitioner

## 2022-01-27 DIAGNOSIS — F419 Anxiety disorder, unspecified: Secondary | ICD-10-CM

## 2022-01-27 DIAGNOSIS — K219 Gastro-esophageal reflux disease without esophagitis: Secondary | ICD-10-CM

## 2022-01-27 DIAGNOSIS — F331 Major depressive disorder, recurrent, moderate: Secondary | ICD-10-CM

## 2022-01-31 ENCOUNTER — Encounter: Payer: Self-pay | Admitting: Nurse Practitioner

## 2022-02-01 ENCOUNTER — Other Ambulatory Visit: Payer: Self-pay | Admitting: Nurse Practitioner

## 2022-02-01 DIAGNOSIS — F331 Major depressive disorder, recurrent, moderate: Secondary | ICD-10-CM

## 2022-02-01 DIAGNOSIS — K219 Gastro-esophageal reflux disease without esophagitis: Secondary | ICD-10-CM

## 2022-02-01 DIAGNOSIS — F419 Anxiety disorder, unspecified: Secondary | ICD-10-CM

## 2022-02-02 ENCOUNTER — Other Ambulatory Visit: Payer: Self-pay | Admitting: Nurse Practitioner

## 2022-02-02 DIAGNOSIS — K219 Gastro-esophageal reflux disease without esophagitis: Secondary | ICD-10-CM

## 2022-02-02 DIAGNOSIS — F419 Anxiety disorder, unspecified: Secondary | ICD-10-CM

## 2022-02-02 DIAGNOSIS — F331 Major depressive disorder, recurrent, moderate: Secondary | ICD-10-CM

## 2022-02-02 MED ORDER — BUPROPION HCL ER (XL) 150 MG PO TB24: 300.0000 mg | ORAL_TABLET | Freq: Every day | ORAL | 1 refills | Status: DC

## 2022-02-02 NOTE — Telephone Encounter (Signed)
Pt checking status of refill request ? ?Reminded pt her provider works on [employer] only and message has been routed to her ? ?Pt states she is out of medication ? ?Please advise ? ? ?
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
## 2022-03-22 ENCOUNTER — Encounter: Payer: Self-pay | Admitting: Nurse Practitioner

## 2022-03-30 ENCOUNTER — Ambulatory Visit (INDEPENDENT_AMBULATORY_CARE_PROVIDER_SITE_OTHER): Payer: No Typology Code available for payment source | Admitting: Nurse Practitioner

## 2022-03-30 ENCOUNTER — Telehealth: Payer: Self-pay | Admitting: Nurse Practitioner

## 2022-03-30 ENCOUNTER — Encounter: Payer: Self-pay | Admitting: Nurse Practitioner

## 2022-03-30 ENCOUNTER — Other Ambulatory Visit: Payer: Self-pay | Admitting: Nurse Practitioner

## 2022-03-30 VITALS — BP 122/70 | HR 84 | Temp 98.5°F | Ht 64.0 in | Wt 228.0 lb

## 2022-03-30 DIAGNOSIS — N289 Disorder of kidney and ureter, unspecified: Secondary | ICD-10-CM | POA: Diagnosis not present

## 2022-03-30 DIAGNOSIS — E669 Obesity, unspecified: Secondary | ICD-10-CM | POA: Insufficient documentation

## 2022-03-30 DIAGNOSIS — D649 Anemia, unspecified: Secondary | ICD-10-CM | POA: Diagnosis not present

## 2022-03-30 DIAGNOSIS — E1159 Type 2 diabetes mellitus with other circulatory complications: Secondary | ICD-10-CM | POA: Insufficient documentation

## 2022-03-30 DIAGNOSIS — N182 Chronic kidney disease, stage 2 (mild): Secondary | ICD-10-CM

## 2022-03-30 DIAGNOSIS — Z862 Personal history of diseases of the blood and blood-forming organs and certain disorders involving the immune mechanism: Secondary | ICD-10-CM | POA: Insufficient documentation

## 2022-03-30 DIAGNOSIS — Z794 Long term (current) use of insulin: Secondary | ICD-10-CM

## 2022-03-30 DIAGNOSIS — I10 Essential (primary) hypertension: Secondary | ICD-10-CM | POA: Insufficient documentation

## 2022-03-30 DIAGNOSIS — E66812 Obesity, class 2: Secondary | ICD-10-CM | POA: Insufficient documentation

## 2022-03-30 DIAGNOSIS — E1165 Type 2 diabetes mellitus with hyperglycemia: Secondary | ICD-10-CM

## 2022-03-30 LAB — BASIC METABOLIC PANEL
BUN: 14 mg/dL (ref 6–23)
CO2: 27 mEq/L (ref 19–32)
Calcium: 9.1 mg/dL (ref 8.4–10.5)
Chloride: 103 mEq/L (ref 96–112)
Creatinine, Ser: 0.96 mg/dL (ref 0.40–1.20)
GFR: 70.5 mL/min (ref >60.00)
Glucose, Bld: 98 mg/dL (ref 70–99)
Potassium: 3.7 mEq/L (ref 3.5–5.1)
Sodium: 137 mEq/L (ref 135–145)

## 2022-03-30 LAB — HEMOGLOBIN A1C: Hgb A1c MFr Bld: 7.2 % — ABNORMAL HIGH (ref 4.6–6.5)

## 2022-03-30 MED ORDER — OZEMPIC (0.25 OR 0.5 MG/DOSE) 2 MG/3ML ~~LOC~~ SOPN: 0.2500 mg | PEN_INJECTOR | SUBCUTANEOUS | 1 refills | Status: DC

## 2022-03-30 MED ORDER — GLIMEPIRIDE 2 MG PO TABS: 2.0000 mg | ORAL_TABLET | Freq: Every day | ORAL | 0 refills | Status: DC

## 2022-03-30 NOTE — Assessment & Plan Note (Addendum)
I encouraged her to follow-up with gastroenterology for evaluation to rule out GI bleeding or colon cancer as cause of her anemia, she reports understanding.

## 2022-03-30 NOTE — Assessment & Plan Note (Signed)
We will repeat metabolic panel today as its been at least 3 months since last 1 was collected.  If GFR less than 90 we will refer to nephrology per patient request.  Patient to continue ARB.

## 2022-03-30 NOTE — Assessment & Plan Note (Signed)
Chronic, uncontrolled.  We had a long discussion regarding treatment options and patient has not tolerated SGLT2's well due to vaginal yeast infections she also does not tolerate metformin due to GI upset.  Last A1c was above goal, we will repeat today for evaluation.  I do think she would be a good candidate for Ozempic.  She denies personal or family history of thyroid disease as well as personal history of pancreatitis.  We will attempt prior authorization to see if can get Ozempic approved by insurance.  In the meantime she will restart her glimepiride daily and follow-up in 1 month.  She will continue on ARB, may consider statin therapy in the future.

## 2022-03-30 NOTE — Telephone Encounter (Signed)
Can you do prior auth for this patient for ozempic? She has tried and failed metformin immediate release and extended release due to intolerable side effects. Side effects were diarrhea, nausea, vomiting. Her last A1C was 8.3 her A1C goal is <7.0. let me know if you have any questions.

## 2022-03-30 NOTE — Progress Notes (Signed)
Established Patient Office Visit  Subjective   Patient ID: Wendy Colon, female    DOB: October 29, 1976  Age: 46 y.o. MRN: [id number]  Chief Complaint  Patient presents with   Follow-up   Diabetes    Patient arrives today for the above.  Last A1c was 8.3.  She was being evaluated by different provider in between my last visit with her in today, they attempted to prescribe Jardiance, Ozempic, and Steglatro but could not get insurance authorization.  Is not clear if prior authorization was attempted or not.  Previously she was on glimepiride 2 mg mouth daily as well as Levemir 10 units daily, but is no longer on either of those medications.  She reports that she is very frustrated and concerned regarding her health care, and this is causing her to make poor decisions with her diet.  She checks a fasting blood sugar at home daily and tells me its been between 1 60-1 80, she does not routinely check a postprandial or preprandial sugar.  She also continues on losartan she is not currently on statin therapy.  Last LDL was 84 and this was collected approximately 5 months ago.  She is also found to have mild anemia with normal MCV.  RDW was elevated and iron studies showed normal iron, but elevated iron binding capacity as well as low ferritin.  I recommended referral to gastroenterology for further evaluation, she reports that she has not yet called their office but plans on doing so.  Last time metabolic panel was collected GFR was 70, I did discuss this with her last time I saw her.  Additionally her albumin to creatinine ratio was less than 30, so I did not refer to nephrology.  Patient reports that she is very very distressed since last time we spoke regarding her kidney function and would like to see nephrologist.  She has not yet had repeat metabolic panel small bowel to verify chronic kidney disease.  Past Medical History:  Diagnosis Date   Anxiety 2018   Diabetes mellitus without complication  ([place])    GERD (gastroesophageal reflux disease) 2004      Review of Systems  Constitutional:  Negative for fever.  Respiratory:  Negative for shortness of breath.   Cardiovascular:  Negative for chest pain.     Objective:     BP 122/70 (BP Location: Left Arm, Patient Position: Sitting, Cuff Size: Large)   Pulse 84   Temp 98.5 F (36.9 C) (Oral)   Ht '5\' 4"'$  (1.626 m)   Wt 228 lb (103.4 kg)   SpO2 98%   BMI 39.14 kg/m  BP Readings from Last 3 Encounters:  03/30/22 122/70  12/09/21 140/64  11/24/21 (!) 142/91   Wt Readings from Last 3 Encounters:  03/30/22 228 lb (103.4 kg)  12/09/21 213 lb 9.6 oz (96.9 kg)  11/24/21 216 lb (98 kg)      Physical Exam Vitals reviewed.  Constitutional:      General: She is not in acute distress.    Appearance: Normal appearance.  HENT:     Head: Normocephalic and atraumatic.  Neck:     Vascular: No carotid bruit.  Cardiovascular:     Rate and Rhythm: Normal rate and regular rhythm.     Pulses: Normal pulses.     Heart sounds: Normal heart sounds.  Pulmonary:     Effort: Pulmonary effort is normal.     Breath sounds: Normal breath sounds.  Skin:    General: Skin  is warm and dry.  Neurological:     General: No focal deficit present.     Mental Status: She is alert and oriented to person, place, and time.  Psychiatric:        Mood and Affect: Mood normal.        Behavior: Behavior normal.        Judgment: Judgment normal.     No results found for any visits on 03/30/22.  Last metabolic panel Lab Results  Component Value Date   GLUCOSE 134 (H) 11/25/2021   NA 137 11/25/2021   K 4.1 11/25/2021   CL 104 11/25/2021   CO2 27 11/25/2021   BUN 12 11/25/2021   CREATININE 0.96 11/25/2021   CALCIUM 9.7 11/25/2021   PROT 7.7 11/25/2021   ALBUMIN 4.5 11/25/2021   BILITOT 0.2 11/25/2021   ALKPHOS 67 11/25/2021   AST 17 11/25/2021   ALT 14 11/25/2021   Last lipids Lab Results  Component Value Date   CHOL 166 11/25/2021    HDL 69.20 11/25/2021   LDLCALC 84 11/25/2021   TRIG 64.0 11/25/2021   CHOLHDL 2 11/25/2021   Last hemoglobin A1c Lab Results  Component Value Date   HGBA1C 8.3 (H) 11/25/2021      The 10-year ASCVD risk score (Arnett DK, et al., 2019) is: 5.9%    Assessment & Plan:   Problem List Items Addressed This Visit       Endocrine   Hyperglycemia due to type 2 diabetes mellitus ([place]) - Primary   Relevant Medications   Semaglutide,0.25 or 0.'5MG'$ /DOS, (OZEMPIC, 0.25 OR 0.5 MG/DOSE,) 2 MG/3ML SOPN   glimepiride (AMARYL) 2 MG tablet   Other Relevant Orders   HgB A1c     Other   Renal dysfunction   Relevant Orders   Basic Metabolic Panel (BMET)    Return in about 4 weeks (around 04/27/2022) for follow-up with Judson Roch.    Ailene Ards, NP

## 2022-03-30 NOTE — Patient Instructions (Addendum)
[facility]: [phone] option 1  Semaglutide Injection What is this medication? SEMAGLUTIDE (SEM a GLOO tide) treats type 2 diabetes. It works by increasing insulin levels in your body, which decreases your blood sugar (glucose). It also reduces the amount of sugar released into the blood and slows down your digestion. It can also be used to lower the risk of heart attack and stroke in people with type 2 diabetes. Changes to diet and exercise are often combined with this medication. This medicine may be used for other purposes; ask your health care provider or pharmacist if you have questions. COMMON BRAND NAME(S): OZEMPIC What should I tell my care team before I take this medication? They need to know if you have any of these conditions: Endocrine tumors (MEN 2) or if someone in your family had these tumors Eye disease, vision problems History of pancreatitis Kidney disease Stomach problems Thyroid cancer or if someone in your family had thyroid cancer An unusual or allergic reaction to semaglutide, other medications, foods, dyes, or preservatives Pregnant or trying to get pregnant Breast-feeding How should I use this medication? This medication is for injection under the skin of your upper leg (thigh), stomach area, or upper arm. It is given once every week (every 7 days). You will be taught how to prepare and give this medication. Use exactly as directed. Take your medication at regular intervals. Do not take it more often than directed. If you use this medication with insulin, you should inject this medication and the insulin separately. Do not mix them together. Do not give the injections right next to each other. Change (rotate) injection sites with each injection. It is important that you put your used needles and syringes in a special sharps container. Do not put them in a trash can. If you do not have a sharps container, call your pharmacist or care team to get one. A special  MedGuide will be given to you by the pharmacist with each prescription and refill. Be sure to read this information carefully each time. This medication comes with INSTRUCTIONS FOR USE. Ask your pharmacist for directions on how to use this medication. Read the information carefully. Talk to your pharmacist or care team if you have questions. Talk to your care team about the use of this medication in children. Special care may be needed. Overdosage: If you think you have taken too much of this medicine contact a poison control center or emergency room at once. NOTE: This medicine is only for you. Do not share this medicine with others. What if I miss a dose? If you miss a dose, take it as soon as you can within 5 days after the missed dose. Then take your next dose at your regular weekly time. If it has been longer than 5 days after the missed dose, do not take the missed dose. Take the next dose at your regular time. Do not take double or extra doses. If you have questions about a missed dose, contact your care team for advice. What may interact with this medication? Other medications for diabetes Many medications may cause changes in blood sugar, these include: Alcohol containing beverages Antiviral medications for HIV or AIDS Aspirin and aspirin-like medications Certain medications for blood pressure, heart disease, irregular heart beat Chromium Diuretics Female hormones, such as estrogens or progestins, birth control pills Fenofibrate Gemfibrozil Isoniazid Lanreotide Female hormones or anabolic steroids MAOIs like Carbex, Eldepryl, Marplan, Nardil, and Parnate Medications for weight loss Medications for allergies, asthma,  cold, or cough Medications for depression, anxiety, or psychotic disturbances Niacin Nicotine NSAIDs, medications for pain and inflammation, like ibuprofen or naproxen Octreotide Pasireotide Pentamidine Phenytoin Probenecid Quinolone antibiotics such as  ciprofloxacin, levofloxacin, ofloxacin Some herbal dietary supplements Steroid medications such as prednisone or cortisone Sulfamethoxazole; trimethoprim Thyroid hormones Some medications can hide the warning symptoms of low blood sugar (hypoglycemia). You may need to monitor your blood sugar more closely if you are taking one of these medications. These include: Beta-blockers, often used for high blood pressure or heart problems (examples include atenolol, metoprolol, propranolol) Clonidine Guanethidine Reserpine This list may not describe all possible interactions. Give your health care provider a list of all the medicines, herbs, non-prescription drugs, or dietary supplements you use. Also tell them if you smoke, drink alcohol, or use illegal drugs. Some items may interact with your medicine. What should I watch for while using this medication? Visit your care team for regular checks on your progress. Drink plenty of fluids while taking this medication. Check with your care team if you get an attack of severe diarrhea, nausea, and vomiting. The loss of too much body fluid can make it dangerous for you to take this medication. A test called the HbA1C (A1C) will be monitored. This is a simple blood test. It measures your blood sugar control over the last 2 to 3 months. You will receive this test every 3 to 6 months. Learn how to check your blood sugar. Learn the symptoms of low and high blood sugar and how to manage them. Always carry a quick-source of sugar with you in case you have symptoms of low blood sugar. Examples include hard sugar candy or glucose tablets. Make sure others know that you can choke if you eat or drink when you develop serious symptoms of low blood sugar, such as seizures or unconsciousness. They must get medical help at once. Tell your care team if you have high blood sugar. You might need to change the dose of your medication. If you are sick or exercising more than usual,  you might need to change the dose of your medication. Do not skip meals. Ask your care team if you should avoid alcohol. Many nonprescription cough and cold products contain sugar or alcohol. These can affect blood sugar. Pens should never be shared. Even if the needle is changed, sharing may result in passing of viruses like hepatitis or HIV. Wear a medical ID bracelet or chain, and carry a card that describes your disease and details of your medication and dosage times. Do not become pregnant while taking this medication. Women should inform their care team if they wish to become pregnant or think they might be pregnant. There is a potential for serious side effects to an unborn child. Talk to your care team for more information. What side effects may I notice from receiving this medication? Side effects that you should report to your care team as soon as possible: Allergic reactions--skin rash, itching, hives, swelling of the face, lips, tongue, or throat Change in vision Dehydration--increased thirst, dry mouth, feeling faint or lightheaded, headache, dark yellow or brown urine Gallbladder problems--severe stomach pain, nausea, vomiting, fever Heart palpitations--rapid, pounding, or irregular heartbeat Kidney injury--decrease in the amount of urine, swelling of the ankles, hands, or feet Pancreatitis--severe stomach pain that spreads to your back or gets worse after eating or when touched, fever, nausea, vomiting Thyroid cancer--new mass or lump in the neck, pain or trouble swallowing, trouble breathing, hoarseness Side effects  that usually do not require medical attention (report to your care team if they continue or are bothersome): Diarrhea Loss of appetite Nausea Stomach pain Vomiting This list may not describe all possible side effects. Call your doctor for medical advice about side effects. You may report side effects to FDA at 1-800-FDA-1088. Where should I keep my medication? Keep  out of the reach of children. Store unopened pens in a refrigerator between 2 and 8 degrees C (36 and 46 degrees F). Do not freeze. Protect from light and heat. After you first use the pen, it can be stored for 56 days at room temperature between 15 and 30 degrees C (59 and 86 degrees F) or in a refrigerator. Throw away your used pen after 56 days or after the expiration date, whichever comes first. Do not store your pen with the needle attached. If the needle is left on, medication may leak from the pen. NOTE: This sheet is a summary. It may not cover all possible information. If you have questions about this medicine, talk to your doctor, pharmacist, or health care provider.  2023 Elsevier/Gold Standard (2021-01-20 00:00:00)

## 2022-03-31 NOTE — Telephone Encounter (Signed)
Error

## 2022-04-03 ENCOUNTER — Encounter: Payer: Self-pay | Admitting: Physician Assistant

## 2022-04-06 IMAGING — US US SOFT TISSUE
1 series · 13 of 16 positions shown · non-contrast
Comparison: None.

CLINICAL DATA: Painful nodule along the anterior chest wall
inferior to the left breast.

EXAM:
ULTRASOUND OF SOFT TISSUES
TECHNIQUE: Ultrasound examination of the head and neck soft tissues was
performed in the area of clinical concern in the anterior chest wall
soft tissues inferior to the left breast

[Series 1: us chest soft tissue · 23 acquisitions, 13 frames shown]
[im 1/23]
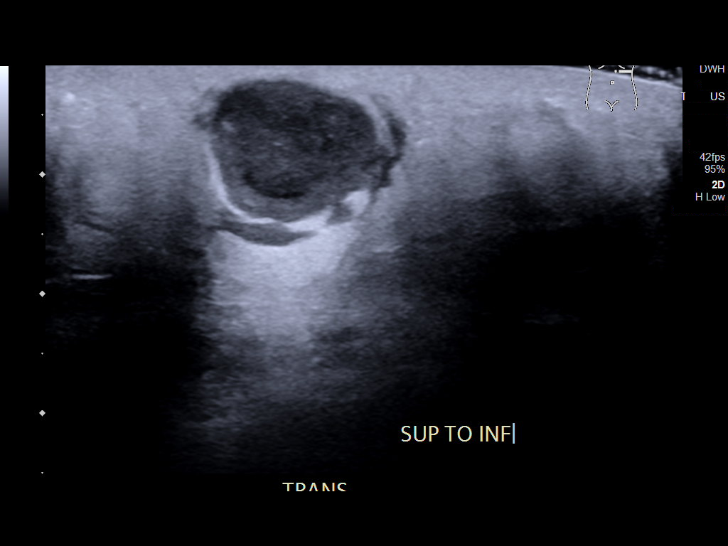
[im 2/23]
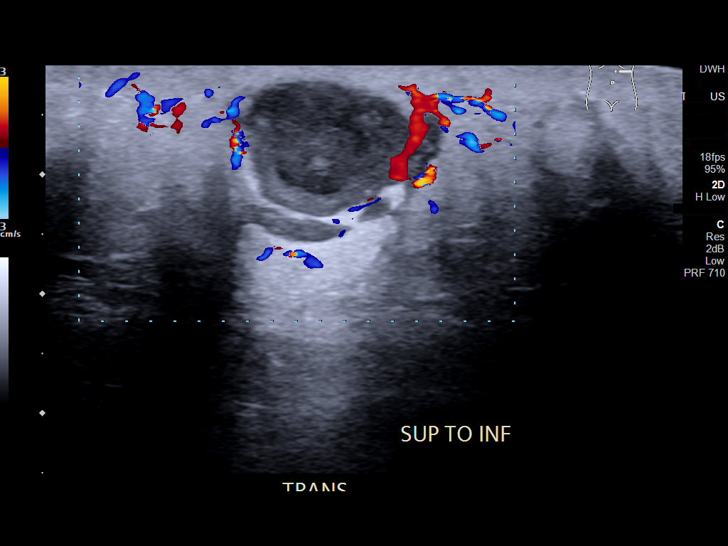
[im 5/23]
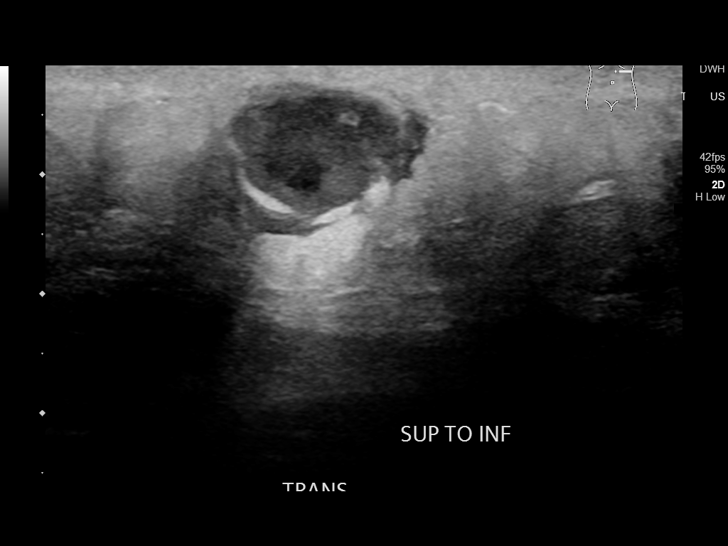
[im 6/23]
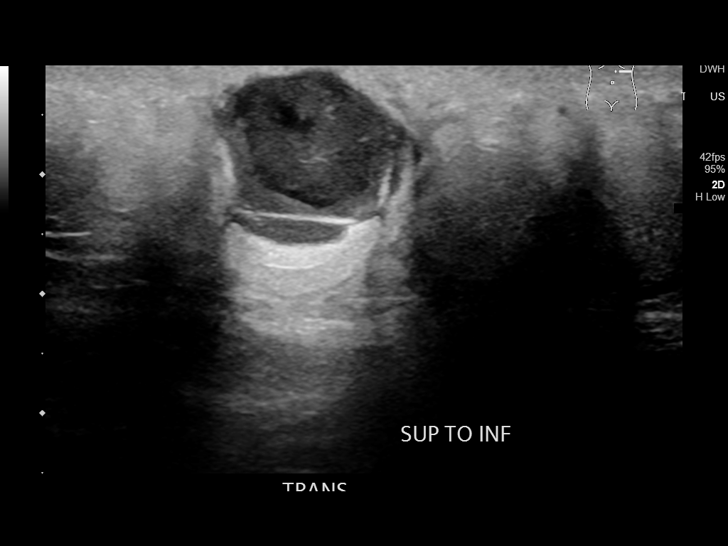
[im 8/23]
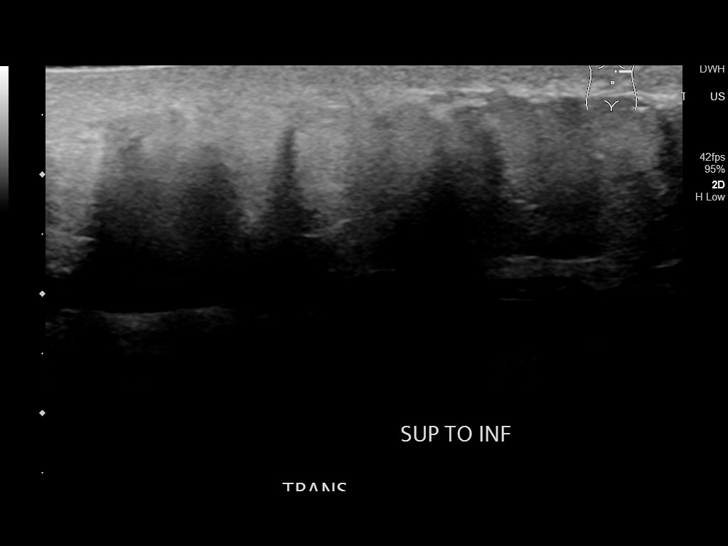
[im 9/23]
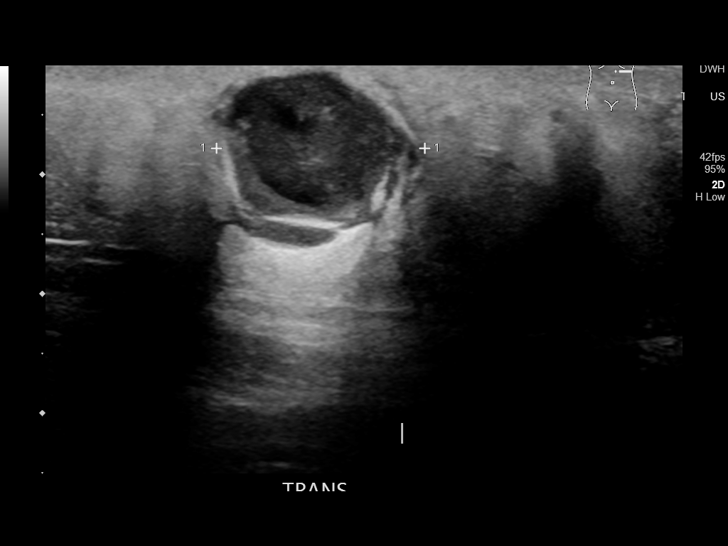
[im 12/23]
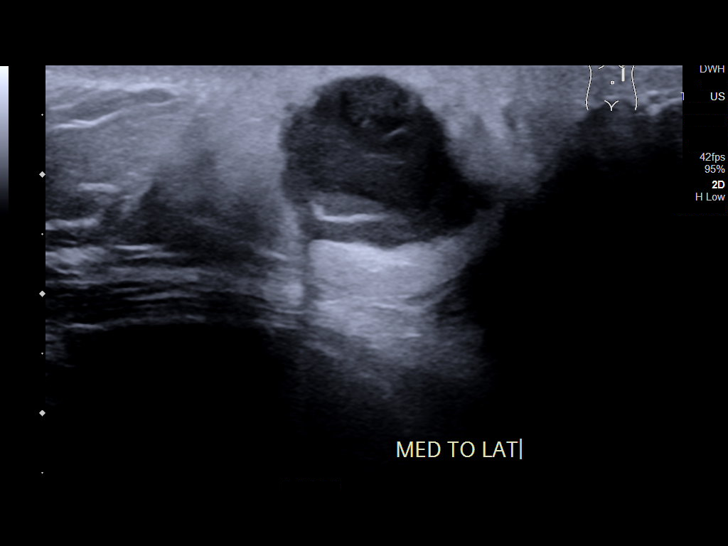
[im 14/23]
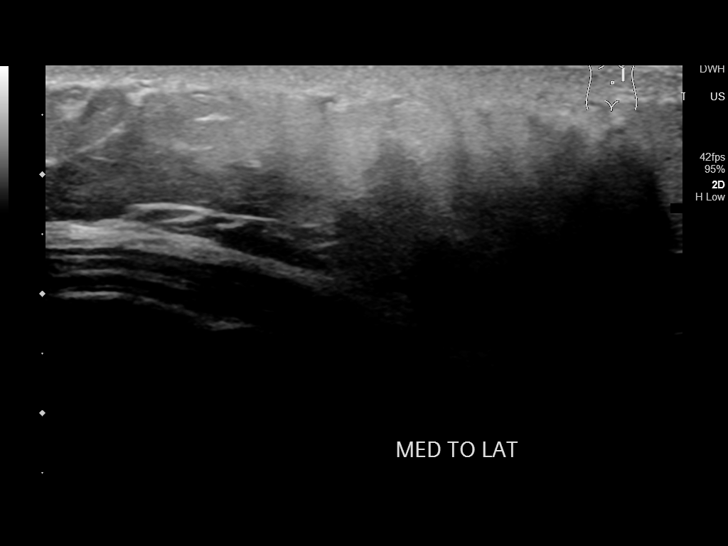
[im 15/23]
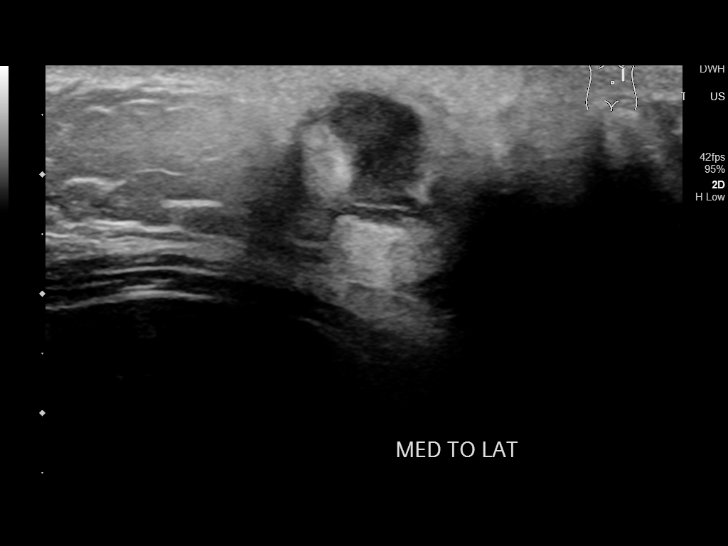
[im 17/23]
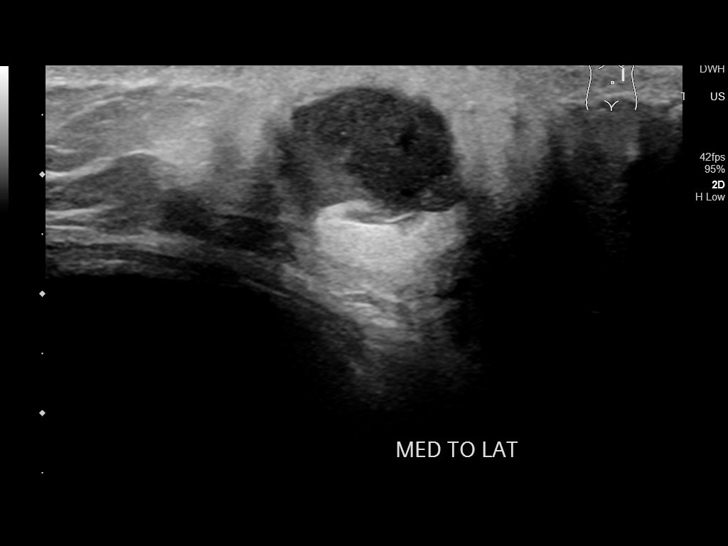
[im 18/23]
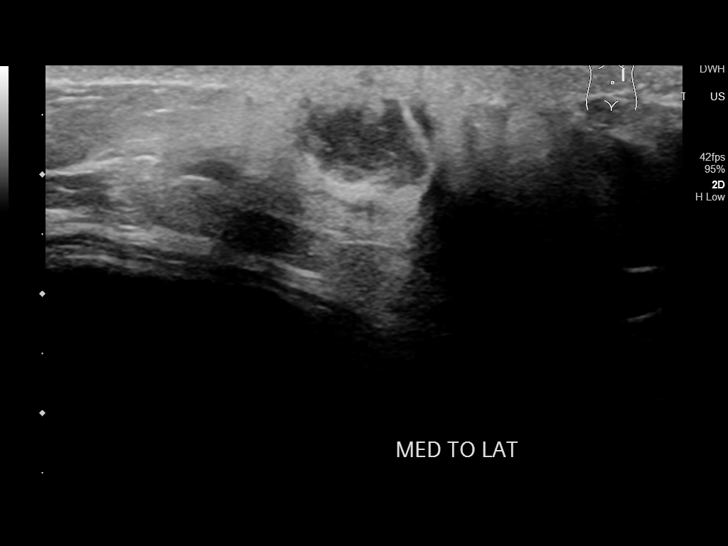
[im 21/23]
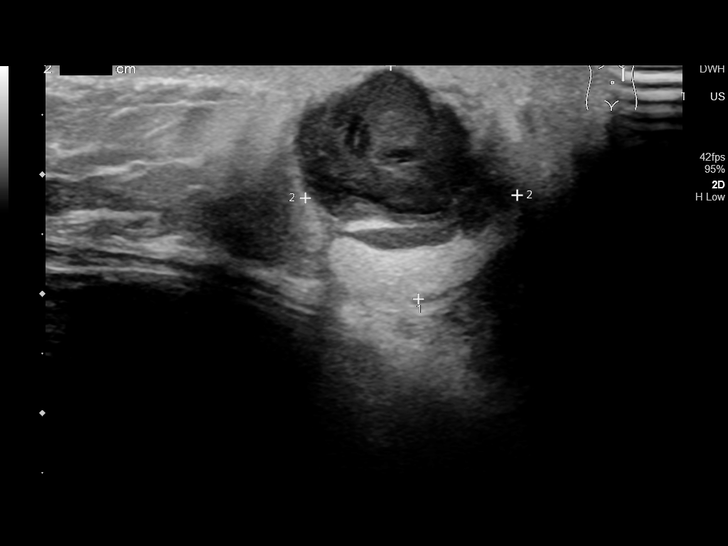
[im 23/23]
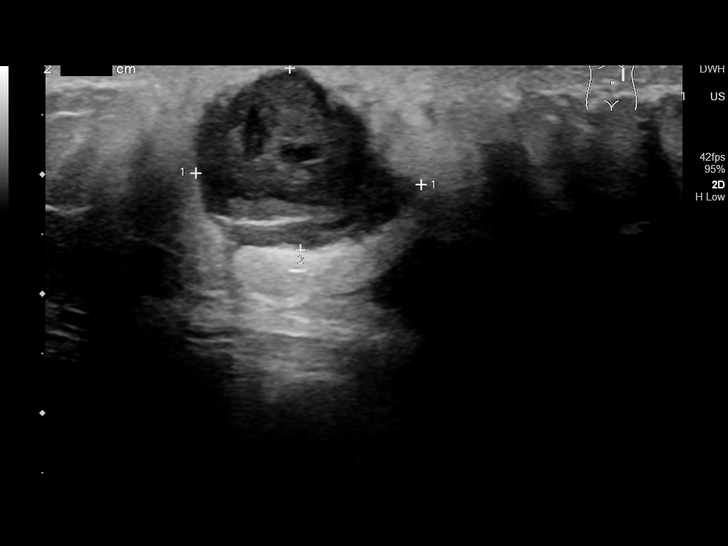

[13 of 16 positions shown; findings below may reference images not displayed]

FINDINGS: Corresponding well to the area of palpable abnormality is a
heterogeneous though predominantly hypoechoic structure measuring
2.0 x 0.8 x 1.8 cm. This does appear contiguous with the superficial
dermal layer. No sinus tract to the skin surface is visualized. Some
more anechoic central components are present as well which could
reflect fluid within this collection. Posterior acoustic enhancement
is noted. Suspect some mild surrounding edematous change with
increased vascularity along the periphery which could reflect
inflammation. A more crescentic hypoattenuating area versus possible
septation seen posteriorly, possibly rupture versus loculation. No
concerning internal color Doppler flow.
IMPRESSION: Features are most suggestive of a an epidermal inclusion cyst. A
hypoattenuating crescentic area posteriorly could reflect cyst
rupture or a septate loculation. Surrounding hyperemic color flow
could also suggest more acute inflammation or infection. Consider
repeat sonographic imaging or MRI assessment if this structure fails
to regress or continues to enlarge following therapeutic
intervention.

## 2022-04-26 NOTE — Progress Notes (Unsigned)
04/27/2022 Wendy Colon [id number] 22-Apr-1976  Referring provider: Ailene Ards, NP Primary GI doctor: Dr. Candis Schatz  ASSESSMENT AND PLAN:   Iron deficiency anemia, unspecified iron deficiency anemia type -     Sod Picosulfate-Mag Ox-Cit Acd (CLENPIQ) 10-3.5-12 MG-GM -GM/160ML SOLN; Take 320 mLs by mouth once for 1 dose. IDA x Jan Possible from menses, if EGD/colon negative follow up GYN I recommend upper gastrointestinal and colorectal evaluation with an EGD and colonoscopy. The rationale for this was discussed. Alternative methods of upper gastrointestinal and colorectal evaluations were discussed as were their inherent limitations. Risk of bowel prep, conscious sedation, and EGD and colonoscopy were discussed. Risks include but are not limited to dehydration, pain, bleeding, cardiopulmonary process, bowel perforation, or other possible adverse outcomes.. Treatment plan was discussed with patient, and agreed upon.  Diabetes 1.5, managed as type 2 ([place]) Can consider GES if negative.   Gastroesophageal reflux disease without esophagitis -     pantoprazole (PROTONIX) 40 MG tablet; Take 1 tablet (40 mg total) by mouth 2 (two) times daily before a meal. Will start the patient on a PPI, will schedule for EGD Lifestyle changes discussed, avoid NSAIDS, ETOH If negative, consider AB [place]  Chronic idiopathic constipation Will do 2 day prep, if colonoscopy negative likely IBS-C, can try linzess or other medications. - Increase fiber/ water intake, decrease caffeine, increase activity level. - Please go to the hospital if you have severe abdominal pain, vomiting, fever, CP, SOB.     Patient Care Team: Ailene Ards, NP as PCP - General (Nurse Practitioner) Hall Busing (Optometry)  HISTORY OF PRESENT ILLNESS: 46 y.o. female with a past medical history of hypertension, diabetes 1.5 ([place]), anemia and others listed below presents for evaluation of anemia.   Has Ozempic listed  on medication list but insurance has not improved it, not on it at this time, last A1c 8.3. Patient was found to have mild anemia 11/25/21 hemoglobin 11.2, MCV normal at 80.  Iron normal at 82 of her ferritin was low 6.6.  B12 372.  Patient still has menses, has been having 2 episodes within 3 months with prolonged menses, this episode has been 17th of May but light bleeding. Does not have a history of heavy menses.  Has a sister that gets transfusions for anemia.   Patient reports significant GERD, has had benefit from dexilant in the past but insurance wont cover. Zantac was helping at one point, she is on omeprazole 20 mg, takes 2 pills once a day with pepcid.  She continues to have symptoms.  Will have night time symptoms of choking in sleep with acid.  She has regurgiation of food at night occ.   She has a lot of nausea, will eat a mint that can help, occ will vomit, has  lots of beltching.  Patient denies dysphagia, melena. Has had stool be dark from iron.  Has had constipation since she was in her 5's.  Patient has a BM every 2-3 times a week if she takes senna tea, has tried miralax and fiber but this worsened bloating. Patient denies hematochezia.  Denies changes in appetite, unintentional weight loss.  Patient denies family history of colon cancer or other gastrointestinal malignancies.Father did have polyps.  Sister with GB removal.   Rare NSAIDS, aleve 2 x a month.  Denies ETOH.   CBC  11/25/2021  HGB 11.2 MCV 80.9 with normocytic anemia WBC 8.1 Platelets 315.0 Anemia panel 12/19/2021  Iron 82 Ferritin 6.6  12/19/2021  B12 372 Kidney function 03/30/2022  BUN 14 Cr 0.96  Potassium 3.7   LFTs 11/25/2021  AST 17 ALT 14 Alkphos 67 TBili 0.2 11/25/2021 TSH 1.25   Current Medications:   Current Outpatient Medications (Endocrine & Metabolic):    glimepiride (AMARYL) 2 MG tablet, Take 1 tablet (2 mg total) by mouth daily.   Semaglutide,0.25 or 0.5MG/DOS, (OZEMPIC, 0.25 OR 0.5  MG/DOSE,) 2 MG/3ML SOPN, Inject 0.25 mg into the skin once a week.  Current Outpatient Medications (Cardiovascular):    losartan (COZAAR) 25 MG tablet, Take 1 tablet (25 mg total) by mouth daily.     Current Outpatient Medications (Other):    blood glucose meter kit and supplies KIT, Dispense based on patient and insurance preference. Use up to four times daily as directed.   buPROPion (WELLBUTRIN XL) 150 MG 24 hr tablet, Take 2 tablets (300 mg total) by mouth daily.   busPIRone (BUSPAR) 5 MG tablet, Take 1 tablet (5 mg total) by mouth 2 (two) times daily as needed (anxiety).   Continuous Blood Gluc Receiver (FREESTYLE LIBRE 14 DAY READER) DEVI, Use to check blood sugar four times a day   Continuous Blood Gluc Sensor (FREESTYLE LIBRE 3 SENSOR) MISC, 1 each by Does not apply route in the morning, at noon, in the evening, and at bedtime.   pantoprazole (PROTONIX) 40 MG tablet, Take 1 tablet (40 mg total) by mouth 2 (two) times daily before a meal.   Sod Picosulfate-Mag Ox-Cit Acd (CLENPIQ) 10-3.5-12 MG-GM -GM/160ML SOLN, Take 320 mLs by mouth once for 1 dose.  Medical History:  Past Medical History:  Diagnosis Date   Anxiety 2018   Depression    Diabetes mellitus without complication ([place])    GERD (gastroesophageal reflux disease) 2004   Allergies:  Allergies  Allergen Reactions   Metformin Hcl Other (See Comments)     Surgical History:  She  has a past surgical history that includes Knee arthroscopy with anterior cruciate ligament (acl) repair (Left). Family History:  Her family history includes Diabetes in her father, mother, sister, sister, and sister; Heart disease in her father and mother; Hypertension in her father and mother. Social History:   reports that she has been smoking cigarettes. She has never used smokeless tobacco. She reports current alcohol use. She reports that she does not use drugs.  REVIEW OF SYSTEMS  : All other systems reviewed and negative except where  noted in the History of Present Illness.   PHYSICAL EXAM: BP (!) 150/100   Pulse 68   Ht _0  (1.626 m)   Wt 216 lb (98 kg)   BMI 37.08 kg/m  General:   Pleasant, well developed female in no acute distress Head:   Normocephalic and atraumatic. Eyes:  sclerae anicteric,conjunctive pink  Heart:   regular rate and rhythm Pulm:  Clear anteriorly; no wheezing Abdomen:   Soft, Non-distended AB, Active bowel sounds. mild tenderness in the LLQ. Without guarding, With guarding, Without rebound, and With rebound, No organomegaly appreciated. Rectal: Not evaluated Extremities:  Without edema. Msk: Symmetrical without gross deformities. Peripheral pulses intact.  Neurologic:  Alert and  oriented x4;  No focal deficits.  Skin:   Dry and intact without significant lesions or rashes. Psychiatric:  Cooperative. Normal mood and affect.    Vladimir Crofts, PA-C 9:14 AM

## 2022-04-27 ENCOUNTER — Other Ambulatory Visit: Payer: Self-pay

## 2022-04-27 ENCOUNTER — Encounter: Payer: Self-pay | Admitting: Physician Assistant

## 2022-04-27 ENCOUNTER — Ambulatory Visit (INDEPENDENT_AMBULATORY_CARE_PROVIDER_SITE_OTHER): Payer: No Typology Code available for payment source | Admitting: Physician Assistant

## 2022-04-27 VITALS — BP 138/80 | HR 68 | Ht 64.0 in | Wt 216.0 lb

## 2022-04-27 DIAGNOSIS — K219 Gastro-esophageal reflux disease without esophagitis: Secondary | ICD-10-CM

## 2022-04-27 DIAGNOSIS — K5904 Chronic idiopathic constipation: Secondary | ICD-10-CM | POA: Diagnosis not present

## 2022-04-27 DIAGNOSIS — E139 Other specified diabetes mellitus without complications: Secondary | ICD-10-CM | POA: Diagnosis not present

## 2022-04-27 DIAGNOSIS — D509 Iron deficiency anemia, unspecified: Secondary | ICD-10-CM | POA: Diagnosis not present

## 2022-04-27 MED ORDER — CLENPIQ 10-3.5-12 MG-GM -GM/160ML PO SOLN: 320.0000 mL | Freq: Once | ORAL | 0 refills | Status: AC

## 2022-04-27 MED ORDER — PANTOPRAZOLE SODIUM 40 MG PO TBEC: 40.0000 mg | DELAYED_RELEASE_TABLET | Freq: Two times a day (BID) | ORAL | 0 refills | Status: DC

## 2022-04-27 NOTE — Progress Notes (Signed)
Agree with the assessment and plan as outlined by Amanda Collier,  PA-C.  Harriett Azar E. Ritta Hammes, MD  

## 2022-04-27 NOTE — Telephone Encounter (Signed)
Error

## 2022-04-27 NOTE — Patient Instructions (Addendum)
If you are age 48 or older, your body mass index should be between 23-30. Your Body mass index is 37.08 kg/m. If this is out of the aforementioned range listed, please consider follow up with your Primary Care Provider.  If you are age 24 or younger, your body mass index should be between 19-25. Your Body mass index is 37.08 kg/m. If this is out of the aformentioned range listed, please consider follow up with your Primary Care Provider.   You have been scheduled for an endoscopy and colonoscopy. Please follow the written instructions given to you at your visit today. Please pick up your prep supplies at the pharmacy within the next 1-3 days. If you use inhalers (even only as needed), please bring them with you on the day of your procedure.   The  GI providers would like to encourage you to use [facility] to communicate with providers for non-urgent requests or questions.  Due to long hold times on the telephone, sending your provider a message by [facility] may be a faster and more efficient way to get a response.  Please allow 48 business hours for a response.  Please remember that this is for non-urgent requests.    Miralax is an osmotic laxative.  It only brings more water into the stool.  This is safe to take daily.  Can take up to 17 gram of miralax twice a day.  Mix with juice or coffee.  Start 1 capful at night for 3-4 days and reassess your response in 3-4 days.  You can increase and decrease the dose based on your response.  Remember, it can take up to 3-4 days to take effect OR for the effects to wear off.  Recommend increasing water and physical activity.  Get a squatty potty to use at home or try a stool, goal is to get your knees above your hips during a bowel movement.  - Drink at least 64-80 ounces of water/liquid per day. - Establish a time to try to move your bowels every day.  For many people, this is after a cup of coffee or after a meal such as breakfast. - Sit all of  the way back on the toilet keeping your back fairly straight and while sitting up, try to rest the tops of your forearms on your upper thighs.   - Raising your feet with a step stool/squatty potty can be helpful to improve the angle that allows your stool to pass through the rectum. - Relax the rectum feeling it bulge toward the toilet water.  If you feel your rectum raising toward your body, you are contracting rather than relaxing. - Breathe in and slowly exhale. "Belly breath" by expanding your belly towards your belly button. Keep belly expanded as you gently direct pressure down and back to the anus.  A low pitched GRRR sound can assist with increasing intra-abdominal pressure.  - Repeat 3-4 times. If unsuccessful, contract the pelvic floor to restore normal tone and get off the toilet.  Avoid excessive straining. - To reduce excessive wiping by teaching your anus to normally contract, place hands on outer aspect of knees and resist knee movement outward.  Hold 5-10 second then place hands just inside of knees and resist inward movement of knees.  Hold 5 seconds.  Repeat a few times each way.  Go to the ER if unable to pass gas, severe AB pain, unable to hold down food, any shortness of breath of chest pain.  Please  take your proton pump inhibitor medication, take TWICE a day  Please take this medication 30 minutes to 1 hour before meals- this makes it more effective.  Avoid spicy and acidic foods Avoid fatty foods Limit your intake of coffee, tea, alcohol, and carbonated drinks Work to maintain a healthy weight Keep the head of the bed elevated at least 3 inches with blocks or a wedge pillow if you are having any nighttime symptoms Stay upright for 2 hours after eating Avoid meals and snacks three to four hours before bedtime Stop smoking  It was a pleasure to see you today!  Thank you for trusting me with your gastrointestinal care!

## 2022-04-28 ENCOUNTER — Ambulatory Visit: Payer: No Typology Code available for payment source | Admitting: Nurse Practitioner

## 2022-05-03 ENCOUNTER — Telehealth: Payer: Self-pay | Admitting: Gastroenterology

## 2022-05-04 ENCOUNTER — Ambulatory Visit (AMBULATORY_SURGERY_CENTER): Payer: No Typology Code available for payment source | Admitting: Gastroenterology

## 2022-05-04 ENCOUNTER — Encounter: Payer: Self-pay | Admitting: Gastroenterology

## 2022-05-04 VITALS — BP 133/61 | HR 92 | Temp 98.9°F | Resp 12 | Ht 64.0 in | Wt 216.0 lb

## 2022-05-04 DIAGNOSIS — D123 Benign neoplasm of transverse colon: Secondary | ICD-10-CM

## 2022-05-04 DIAGNOSIS — D509 Iron deficiency anemia, unspecified: Secondary | ICD-10-CM | POA: Diagnosis present

## 2022-05-04 DIAGNOSIS — D128 Benign neoplasm of rectum: Secondary | ICD-10-CM | POA: Diagnosis not present

## 2022-05-04 DIAGNOSIS — K2950 Unspecified chronic gastritis without bleeding: Secondary | ICD-10-CM | POA: Diagnosis not present

## 2022-05-04 DIAGNOSIS — B9681 Helicobacter pylori [H. pylori] as the cause of diseases classified elsewhere: Secondary | ICD-10-CM

## 2022-05-04 DIAGNOSIS — K5904 Chronic idiopathic constipation: Secondary | ICD-10-CM

## 2022-05-04 MED ORDER — SODIUM CHLORIDE 0.9 % IV SOLN: 500.0000 mL | Freq: Once | INTRAVENOUS | Status: DC

## 2022-05-04 NOTE — Patient Instructions (Signed)
Handout on polyps given.    YOU HAD AN ENDOSCOPIC PROCEDURE TODAY AT [facility]:   Refer to the procedure report that was given to you for any specific questions about what was found during the examination.  If the procedure report does not answer your questions, please call your gastroenterologist to clarify.  If you requested that your care partner not be given the details of your procedure findings, then the procedure report has been included in a sealed envelope for you to review at your convenience later.  YOU SHOULD EXPECT: Some feelings of bloating in the abdomen. Passage of more gas than usual.  Walking can help get rid of the air that was put into your GI tract during the procedure and reduce the bloating. If you had a lower endoscopy (such as a colonoscopy or flexible sigmoidoscopy) you may notice spotting of blood in your stool or on the toilet paper. If you underwent a bowel prep for your procedure, you may not have a normal bowel movement for a few days.  Please Note:  You might notice some irritation and congestion in your nose or some drainage.  This is from the oxygen used during your procedure.  There is no need for concern and it should clear up in a day or so.  SYMPTOMS TO REPORT IMMEDIATELY:  Following lower endoscopy (colonoscopy or flexible sigmoidoscopy):  Excessive amounts of blood in the stool  Significant tenderness or worsening of abdominal pains  Swelling of the abdomen that is new, acute  Fever of 100F or higher  Following upper endoscopy (EGD)  Vomiting of blood or coffee ground material  New chest pain or pain under the shoulder blades  Painful or persistently difficult swallowing  New shortness of breath  Fever of 100F or higher  Black, tarry-looking stools  For urgent or emergent issues, a gastroenterologist can be reached at any hour by calling [phone]. Do not use MyChart messaging for urgent concerns.    DIET:  We do  recommend a small meal at first, but then you may proceed to your regular diet.  Drink plenty of fluids but you should avoid alcoholic beverages for 24 hours.  ACTIVITY:  You should plan to take it easy for the rest of today and you should NOT DRIVE or use heavy machinery until tomorrow (because of the sedation medicines used during the test).    FOLLOW UP: Our staff will call the number listed on your records the next business day following your procedure.  We will call around 7:15- 8:00 am to check on you and address any questions or concerns that you may have regarding the information given to you following your procedure. If we do not reach you, we will leave a message.  If you develop any symptoms (ie: fever, flu-like symptoms, shortness of breath, cough etc.) before then, please call [phone].  If you test positive for Covid 19 in the 2 weeks post procedure, please call and report this information to [place].    If any biopsies were taken you will be contacted by phone or by letter within the next 1-3 weeks.  Please call us at [phone] if you have not heard about the biopsies in 3 weeks.    SIGNATURES/CONFIDENTIALITY: You and/or your care partner have signed paperwork which will be entered into your electronic medical record.  These signatures attest to the fact that that the information above on your After Visit Summary has been reviewed and is  understood.  Full responsibility of the confidentiality of this discharge information lies with you and/or your care-partner.

## 2022-05-04 NOTE — Op Note (Signed)
[facility] Patient Name: Wendy Colon Procedure Date: 05/04/2022 2:19 PM MRN: [id number] Endoscopist: Nicki Reaper E. Candis Schatz , MD Age: 46 Referring MD:  Date of Birth: June 03, 1976 Gender: Female Account #: [id number] Procedure:                Colonoscopy Indications:              Iron deficiency anemia Medicines:                Monitored Anesthesia Care Procedure:                Pre-Anesthesia Assessment:                           - Prior to the procedure, a History and Physical                            was performed, and patient medications and                            allergies were reviewed. The patient's tolerance of                            previous anesthesia was also reviewed. The risks                            and benefits of the procedure and the sedation                            options and risks were discussed with the patient.                            All questions were answered, and informed consent                            was obtained. Prior Anticoagulants: The patient has                            taken no previous anticoagulant or antiplatelet                            agents. ASA Grade Assessment: II - A patient with                            mild systemic disease. After reviewing the risks                            and benefits, the patient was deemed in                            satisfactory condition to undergo the procedure.                           After obtaining informed consent, the colonoscope  was passed under direct vision. Throughout the                            procedure, the patient's blood pressure, pulse, and                            oxygen saturations were monitored continuously. The                            Olympus CF-HQ190L ([id number]) Colonoscope was                            introduced through the anus and advanced to the the                            cecum, identified by appendiceal  orifice and                            ileocecal valve. The colonoscopy was performed                            without difficulty. The patient tolerated the                            procedure well. The quality of the bowel                            preparation was adequate. The ileocecal valve,                            appendiceal orifice, and rectum were photographed.                            The bowel preparation used was MoviPrep via split                            dose instruction. Scope In: 3:00:38 PM Scope Out: 3:25:14 PM Scope Withdrawal Time: 0 hours 20 minutes 31 seconds  Total Procedure Duration: 0 hours 24 minutes 36 seconds  Findings:                 The perianal and digital rectal examinations were                            normal. Pertinent negatives include normal                            sphincter tone and no palpable rectal lesions.                           Three sessile polyps were found in the transverse                            colon. The polyps were 3 to 8 mm in size. These  polyps were removed with a cold snare. Resection                            and retrieval were complete. Estimated blood loss                            was minimal.                           A 2 mm polyp was found in the rectum. The polyp was                            sessile. The polyp was removed with a cold snare.                            Resection and retrieval were complete. Estimated                            blood loss was minimal.                           The exam was otherwise normal throughout the                            examined colon.                           The retroflexed view of the distal rectum and anal                            verge was normal and showed no anal or rectal                            abnormalities. Complications:            No immediate complications. Estimated Blood Loss:     Estimated blood loss was  minimal. Impression:               - Three 3 to 8 mm polyps in the transverse colon,                            removed with a cold snare. Resected and retrieved.                           - One 2 mm polyp in the rectum, removed with a cold                            snare. Resected and retrieved.                           - The distal rectum and anal verge are normal on                            retroflexion view.                           -  No abnormalities to explain patient's iron                            deficiency anemia.                           - Suspect anemia is secondary to menstrual blood                            loss. Recommendation:           - Patient has a contact number available for                            emergencies. The signs and symptoms of potential                            delayed complications were discussed with the                            patient. Return to normal activities tomorrow.                            Written discharge instructions were provided to the                            patient.                           - Resume previous diet.                           - Continue present medications.                           - Await pathology results.                           - Repeat colonoscopy (date not yet determined) for                            surveillance based on pathology results. Bralee Feldt E. Candis Schatz, MD 05/04/2022 3:37:29 PM This report has been signed electronically.

## 2022-05-04 NOTE — Progress Notes (Signed)
History and Physical Interval Note:  05/04/2022 2:16 PM  Wendy Colon  has presented today for endoscopic procedure(s), with the diagnosis of  Encounter Diagnoses  Name Primary?   Iron deficiency anemia, unspecified iron deficiency anemia type Yes   Chronic idiopathic constipation   .  The various methods of evaluation and treatment have been discussed with the patient and/or family. After consideration of risks, benefits and other options for treatment, the patient has consented to  the endoscopic procedure(s).   The patient's history has been reviewed, patient examined, no change in status, stable for endoscopic procedure(s).  I have reviewed the patient's chart and labs.  Questions were answered to the patient's satisfaction.     [name] E. Candis Schatz, MD [facility]

## 2022-05-04 NOTE — Op Note (Signed)
[facility] Patient Name: Wendy Colon Procedure Date: 05/04/2022 2:19 PM MRN: [id number] Endoscopist: Nicki Reaper E. Candis Schatz , MD Age: 46 Referring MD:  Date of Birth: Jun 09, 1976 Gender: Female Account #: [id number] Procedure:                Upper GI endoscopy Indications:              Iron deficiency anemia Medicines:                Monitored Anesthesia Care Procedure:                Pre-Anesthesia Assessment:                           - Prior to the procedure, a History and Physical                            was performed, and patient medications and                            allergies were reviewed. The patient's tolerance of                            previous anesthesia was also reviewed. The risks                            and benefits of the procedure and the sedation                            options and risks were discussed with the patient.                            All questions were answered, and informed consent                            was obtained. Prior Anticoagulants: The patient has                            taken no previous anticoagulant or antiplatelet                            agents. ASA Grade Assessment: II - A patient with                            mild systemic disease. After reviewing the risks                            and benefits, the patient was deemed in                            satisfactory condition to undergo the procedure.                           After obtaining informed consent, the endoscope was  passed under direct vision. Throughout the                            procedure, the patient's blood pressure, pulse, and                            oxygen saturations were monitored continuously. The                            GIF HQ190 #[id number] was introduced through the                            mouth, and advanced to the third part of duodenum.                            The upper GI endoscopy was  accomplished without                            difficulty. The patient tolerated the procedure                            well. Scope In: Scope Out: Findings:                 The examined portions of the nasopharynx,                            oropharynx and larynx were normal.                           The examined esophagus was normal.                           The entire examined stomach was normal. Biopsies                            were taken with a cold forceps for Helicobacter                            pylori testing. Estimated blood loss was minimal.                           The examined duodenum was normal. Biopsies for                            histology were taken with a cold forceps for                            evaluation of celiac disease. Estimated blood loss                            was minimal. Complications:            No immediate complications. Estimated Blood Loss:     Estimated blood loss was minimal. Impression:               -  The examined portions of the nasopharynx,                            oropharynx and larynx were normal.                           - Normal esophagus.                           - Normal stomach. Biopsied.                           - Normal examined duodenum. Biopsied. Recommendation:           - Patient has a contact number available for                            emergencies. The signs and symptoms of potential                            delayed complications were discussed with the                            patient. Return to normal activities tomorrow.                            Written discharge instructions were provided to the                            patient.                           - Resume previous diet.                           - Continue present medications.                           - Await pathology results. Gaige Fussner E. Candis Schatz, MD 05/04/2022 3:32:33 PM This report has been signed electronically.

## 2022-05-04 NOTE — Progress Notes (Signed)
Called to room to assist during endoscopic procedure.  Patient ID and intended procedure confirmed with present staff. Received instructions for my participation in the procedure from the performing physician.  

## 2022-05-04 NOTE — Progress Notes (Signed)
Sedate, gd SR, tolerated procedure well, VSS, report to RN 

## 2022-05-05 ENCOUNTER — Telehealth: Payer: Self-pay | Admitting: *Deleted

## 2022-05-05 NOTE — Telephone Encounter (Signed)
  Follow up Call-     05/04/2022    2:11 PM  Call back number  Post procedure Call Back phone  # [phone]  Permission to leave phone message Yes     Patient questions:  Do you have a fever, pain , or abdominal swelling? No. Pain Score  0 *  Have you tolerated food without any problems? Yes.    Have you been able to return to your normal activities? Yes.    Do you have any questions about your discharge instructions: Diet   No. Medications  No. Follow up visit  No.  Do you have questions or concerns about your Care? No.  Actions: * If pain score is 4 or above: No action needed, pain <4.

## 2022-05-11 ENCOUNTER — Telehealth: Payer: Self-pay | Admitting: Nurse Practitioner

## 2022-05-11 ENCOUNTER — Ambulatory Visit (INDEPENDENT_AMBULATORY_CARE_PROVIDER_SITE_OTHER): Payer: No Typology Code available for payment source | Admitting: Nurse Practitioner

## 2022-05-11 ENCOUNTER — Other Ambulatory Visit: Payer: Self-pay

## 2022-05-11 VITALS — BP 132/84 | HR 93 | Temp 98.7°F | Ht 64.0 in | Wt 215.0 lb

## 2022-05-11 DIAGNOSIS — D509 Iron deficiency anemia, unspecified: Secondary | ICD-10-CM | POA: Diagnosis not present

## 2022-05-11 DIAGNOSIS — I10 Essential (primary) hypertension: Secondary | ICD-10-CM

## 2022-05-11 DIAGNOSIS — K219 Gastro-esophageal reflux disease without esophagitis: Secondary | ICD-10-CM

## 2022-05-11 DIAGNOSIS — E1165 Type 2 diabetes mellitus with hyperglycemia: Secondary | ICD-10-CM | POA: Diagnosis not present

## 2022-05-11 DIAGNOSIS — Z794 Long term (current) use of insulin: Secondary | ICD-10-CM

## 2022-05-11 DIAGNOSIS — F331 Major depressive disorder, recurrent, moderate: Secondary | ICD-10-CM | POA: Diagnosis not present

## 2022-05-11 DIAGNOSIS — A048 Other specified bacterial intestinal infections: Secondary | ICD-10-CM

## 2022-05-11 DIAGNOSIS — F419 Anxiety disorder, unspecified: Secondary | ICD-10-CM

## 2022-05-11 LAB — CBC
HCT: 36.7 % (ref 36.0–46.0)
Hemoglobin: 11.9 g/dL — ABNORMAL LOW (ref 12.0–15.0)
MCHC: 32.5 g/dL (ref 30.0–36.0)
MCV: 84.6 fl (ref 78.0–100.0)
Platelets: 268 10*3/uL (ref 150.0–400.0)
RBC: 4.34 Mil/uL (ref 3.87–5.11)
RDW: 21.4 % — ABNORMAL HIGH (ref 11.5–15.5)
WBC: 8.1 10*3/uL (ref 4.0–10.5)

## 2022-05-11 LAB — IRON: Iron: 144 ug/dL (ref 42–145)

## 2022-05-11 LAB — FERRITIN: Ferritin: 22.4 ng/mL (ref 10.0–291.0)

## 2022-05-11 MED ORDER — OZEMPIC (0.25 OR 0.5 MG/DOSE) 2 MG/3ML ~~LOC~~ SOPN: 0.5000 mg | PEN_INJECTOR | SUBCUTANEOUS | 1 refills | Status: DC

## 2022-05-11 MED ORDER — BUSPIRONE HCL 5 MG PO TABS: 5.0000 mg | ORAL_TABLET | Freq: Two times a day (BID) | ORAL | 2 refills | Status: DC | PRN

## 2022-05-11 MED ORDER — METRONIDAZOLE 250 MG PO TABS: 250.0000 mg | ORAL_TABLET | Freq: Four times a day (QID) | ORAL | 0 refills | Status: DC

## 2022-05-11 MED ORDER — LOSARTAN POTASSIUM 25 MG PO TABS: 25.0000 mg | ORAL_TABLET | Freq: Every day | ORAL | 2 refills | Status: DC

## 2022-05-11 MED ORDER — DOXYCYCLINE HYCLATE 100 MG PO CAPS: 100.0000 mg | ORAL_CAPSULE | Freq: Two times a day (BID) | ORAL | 0 refills | Status: DC

## 2022-05-11 MED ORDER — OZEMPIC (0.25 OR 0.5 MG/DOSE) 2 MG/3ML ~~LOC~~ SOPN: 0.2500 mg | PEN_INJECTOR | SUBCUTANEOUS | 1 refills | Status: DC

## 2022-05-11 MED ORDER — BISMUTH 262 MG PO CHEW: 524.0000 mg | CHEWABLE_TABLET | Freq: Four times a day (QID) | ORAL | 0 refills | Status: DC

## 2022-05-11 NOTE — Progress Notes (Signed)
Wendy Colon,  The biopsies from the recent upper GI Endoscopy were notable for H. Pylori gastritis.  This is a known cause of iron deficiency anemia.  We will plan on treating with quad therapy as below. It is very important to take the medications as directed and not miss doses, as the bacteria can be very difficult to eradicate.  Please confirm no medication allergies to the prescribed regimen.   1) Pantoprazole 40 mg 2 times a day x 14 d 2) Pepto Bismol 2 tabs (262 mg each) 4 times a day x 14 d 3) Metronidazole 250 mg 4 times a day x 14 d 4) doxycycline 100 mg 2 times a day x 14 d  4 weeks after treatment completed, check H. Pylori stool antigen to confirm eradication (must be off pantoprazole for 2 weeks prior to specimen submission)   Also three of the four polyps that I removed during your recent procedure were completely benign but were proven to be "pre-cancerous" polyps that MAY have grown into cancers if they had not been removed.  Studies shows that at least 20% of women over age 13 and 30% of men over age 48 have pre-cancerous polyps.  Based on current nationally recognized surveillance guidelines, I recommend that you have a repeat colonoscopy in 5 years.   If you develop any new rectal bleeding, abdominal pain or significant bowel habit changes, please contact me before then.  Vaughan Basta,  Can you please place the orders for the medications (she should already have Protonix) and the stool antigen test?

## 2022-05-11 NOTE — Telephone Encounter (Signed)
Will you work on prior authorization for ozempic for this? Thank you. You can refer t the office visit with me on 03/30/22 for additional information.

## 2022-05-11 NOTE — Progress Notes (Signed)
Established Patient Office Visit  Subjective   Patient ID: Wendy Colon, female    DOB: August 18, 1976  Age: 46 y.o. MRN: [id number]  Chief Complaint  Patient presents with   53mofollow up    No questions or concerns    Diabetes: Currently on glimepiride 260mday alone.  Has had Ozempic prescribed unfortunately patient has not been able to fill this due to insurance not approving this as of right now.  Considering reattempting prior authorization.  She has tried and failed SGLT2's due to vaginal yeast infections and does not tolerate metformin due to GI upset.  Last A1c above goal at 7.2.  Patient has been actively making changes to diet aimed at helping her with control of her diabetes.  She is on ARB.  She is not on statin currently.  Last microalbumin to creatinine ratio was 2.9.  EGFR slightly below normal at 70, patient requested referral to nephrology.  Referral has been ordered but patient has not heard from their office to schedule appointment as of yet.  Iron Deficiency Anemia: Has anemia, has history of pica but currently not craving ice.  Recently underwent colonoscopy and endoscopy which ruled out GI source of blood.  Patient does report heavy menstrual period, reports most recent.  Has been ongoing for 2 months.  She reports history of uterine fibroid.  She does report that she notified OB/GYN of recent prolonged menstrual cycle, and was told to follow-up in person.  She has not yet made this appointment but plans to do so.  She is on ferrous sulfate by mouth twice daily.  Patient also asking for refill on losartan for treatment of blood pressure and  BuSpar for treatment of anxiety as needed    Review of Systems  Constitutional:  Negative for malaise/fatigue.  Cardiovascular:  Negative for chest pain.  Neurological:  Negative for dizziness and headaches.      Objective:     BP 132/84 (BP Location: Right Arm, Patient Position: Sitting, Cuff Size: Large)   Pulse 93   Temp  98.7 F (37.1 C) (Oral)   Ht 5' 4"  (1.626 m)   Wt 215 lb (97.5 kg)   SpO2 98%   BMI 36.90 kg/m    Physical Exam Vitals reviewed.  Constitutional:      General: She is not in acute distress.    Appearance: Normal appearance.  HENT:     Head: Normocephalic and atraumatic.  Neck:     Vascular: No carotid bruit.  Cardiovascular:     Rate and Rhythm: Normal rate and regular rhythm.     Pulses: Normal pulses.     Heart sounds: Normal heart sounds.  Pulmonary:     Effort: Pulmonary effort is normal.     Breath sounds: Normal breath sounds.  Skin:    General: Skin is warm and dry.  Neurological:     General: No focal deficit present.     Mental Status: She is alert and oriented to person, place, and time.  Psychiatric:        Mood and Affect: Mood normal.        Behavior: Behavior normal.        Judgment: Judgment normal.      No results found for any visits on 05/11/22.    The 10-year ASCVD risk score (Arnett DK, et al., 2019) is: 8.3%    Assessment & Plan:  Diabetes: We will provide samples of semaglutide today, will really attempt prior authorization.  Patient  to follow-up in approximately 4 weeks.  At which point we should discuss also starting statin therapy.  Patient has been referred to endocrinology as well and she plans on following up as scheduled.  She again reinforced that she has no personal or family history of thyroid cancer or pancreatitis.  She was warned of possible side effects regarding semaglutide and how to administer properly.  We also discussed possibility of hypoglycemia especially because patient is on glimepiride.  Recommend stopping glimepiride if she experiences hypoglycemic events, she reports her understanding. Anemia: Patient encouraged to follow-up with OB/GYN for assistance with managing her long and heavy menstrual cycle.  She reports understanding.  She is to continue ferrous sulfate.  We will check CBC as well as ferritin and iron levels  today. Refills for buspirone and losartan ordered today. Problem List Items Addressed This Visit       Endocrine   Hyperglycemia due to type 2 diabetes mellitus ([place])   Relevant Medications   losartan (COZAAR) 25 MG tablet   Semaglutide,0.25 or 0.5MG/DOS, (OZEMPIC, 0.25 OR 0.5 MG/DOSE,) 2 MG/3ML SOPN     Other   Anemia - Primary   Relevant Medications   ferrous sulfate 324 (65 Fe) MG TBEC   Other Relevant Orders   CBC   Iron   Ferritin   Other Visit Diagnoses     Hypertension, unspecified type       Relevant Medications   losartan (COZAAR) 25 MG tablet   Moderate episode of recurrent major depressive disorder (HCC)       Relevant Medications   busPIRone (BUSPAR) 5 MG tablet   Anxiety       Relevant Medications   busPIRone (BUSPAR) 5 MG tablet   Gastroesophageal reflux disease, unspecified whether esophagitis present       Relevant Medications   busPIRone (BUSPAR) 5 MG tablet       Return in about 1 month (around 06/11/2022) for Follow-up With Ardith Lewman.    Ailene Ards, NP

## 2022-05-17 ENCOUNTER — Other Ambulatory Visit: Payer: Self-pay | Admitting: Nurse Practitioner

## 2022-05-17 DIAGNOSIS — Z794 Long term (current) use of insulin: Secondary | ICD-10-CM

## 2022-05-17 MED ORDER — FREESTYLE LIBRE 3 SENSOR MISC: 1.0000 | Freq: Four times a day (QID) | 0 refills | Status: DC

## 2022-05-17 MED ORDER — BLOOD GLUCOSE MONITOR KIT: PACK | 0 refills | Status: DC

## 2022-05-17 NOTE — Telephone Encounter (Signed)
Spoke with the pharmacy yesterday and they stated that they do have it in stock but an invalid PA number was submitted.

## 2022-05-18 NOTE — Telephone Encounter (Signed)
Instructed to call script [facility], waiting for a call back.

## 2022-05-18 NOTE — Telephone Encounter (Signed)
Starting another PA, not sure what happened with the first one.

## 2022-05-19 NOTE — Telephone Encounter (Signed)
Wendy Colon with [facility] has called back and stated there was questions the pharmacist was needing to finish up the PA. I was able to answer those question. Wendy Colon states once the PA has been updated with these answers they send over the determination letter.

## 2022-05-19 NOTE — Telephone Encounter (Signed)
LVM for a call back with [facility] about PA.

## 2022-05-23 ENCOUNTER — Telehealth: Payer: Self-pay

## 2022-05-23 MED ORDER — GLUCOSE BLOOD VI STRP: ORAL_STRIP | 12 refills | Status: DC

## 2022-05-23 NOTE — Telephone Encounter (Signed)
Approval received and faxed to pharmacy:

## 2022-05-23 NOTE — Telephone Encounter (Signed)
Received a call back from [place], patient approved until 05/30/2023.

## 2022-05-23 NOTE — Telephone Encounter (Signed)
PA submitted through [email or web address]

## 2022-05-29 ENCOUNTER — Ambulatory Visit (INDEPENDENT_AMBULATORY_CARE_PROVIDER_SITE_OTHER): Payer: No Typology Code available for payment source

## 2022-05-29 ENCOUNTER — Encounter: Payer: Self-pay | Admitting: Podiatry

## 2022-05-29 ENCOUNTER — Ambulatory Visit (INDEPENDENT_AMBULATORY_CARE_PROVIDER_SITE_OTHER): Payer: No Typology Code available for payment source | Admitting: Podiatry

## 2022-05-29 DIAGNOSIS — S90851A Superficial foreign body, right foot, initial encounter: Secondary | ICD-10-CM

## 2022-05-29 DIAGNOSIS — L03115 Cellulitis of right lower limb: Secondary | ICD-10-CM | POA: Diagnosis not present

## 2022-05-29 DIAGNOSIS — L02611 Cutaneous abscess of right foot: Secondary | ICD-10-CM

## 2022-05-29 DIAGNOSIS — L02619 Cutaneous abscess of unspecified foot: Secondary | ICD-10-CM

## 2022-05-29 MED ORDER — AMOXICILLIN-POT CLAVULANATE 875-125 MG PO TABS: 1.0000 | ORAL_TABLET | Freq: Two times a day (BID) | ORAL | 0 refills | Status: DC

## 2022-05-29 NOTE — Progress Notes (Signed)
Subjective:  Patient ID: Wendy Colon, female    DOB: [birth year],  MRN: [id number] HPI Chief Complaint  Patient presents with   Foot Pain    Plantar foot right - raised, callused area x 1-2 weeks, very tender, doesn't remember stepping on anything, kept callus pad over to offload   New Patient (Initial Visit)   Diabetes    Last a1c was 7.2    46 y.o. female presents with the above complaint.   ROS: Denies fever chills nausea vomit muscle aches pains calf pain back pain chest pain shortness of breath.  Past Medical History:  Diagnosis Date   Anxiety 2018   Depression    Diabetes mellitus without complication (HCC)    GERD (gastroesophageal reflux disease) 2004   Past Surgical History:  Procedure Laterality Date   KNEE ARTHROSCOPY WITH ANTERIOR CRUCIATE LIGAMENT (ACL) REPAIR Left     Current Outpatient Medications:    amoxicillin-clavulanate (AUGMENTIN) 875-125 MG tablet, Take 1 tablet by mouth 2 (two) times daily., Disp: 20 tablet, Rfl: 0   Bismuth 262 MG CHEW, Chew 524 mg by mouth in the morning, at noon, in the evening, and at bedtime., Disp: 112 tablet, Rfl: 0   blood glucose meter kit and supplies KIT, Dispense based on patient and insurance preference. Use up to four times daily as directed., Disp: 1 each, Rfl: 0   buPROPion (WELLBUTRIN XL) 150 MG 24 hr tablet, Take 2 tablets (300 mg total) by mouth daily., Disp: 180 tablet, Rfl: 1   busPIRone (BUSPAR) 5 MG tablet, Take 1 tablet (5 mg total) by mouth 2 (two) times daily as needed (anxiety)., Disp: 180 tablet, Rfl: 2   Continuous Blood Gluc Receiver (FREESTYLE LIBRE 14 DAY READER) DEVI, Use to check blood sugar four times a day, Disp: 1 each, Rfl: 6   Continuous Blood Gluc Sensor (FREESTYLE LIBRE 3 SENSOR) MISC, 1 each by Does not apply route in the morning, at noon, in the evening, and at bedtime., Disp: 1 each, Rfl: 0   doxycycline (VIBRAMYCIN) 100 MG capsule, Take 1 capsule (100 mg total) by mouth 2 (two) times daily.,  Disp: 28 capsule, Rfl: 0   ferrous sulfate 324 (65 Fe) MG TBEC, Take 1 tablet by mouth 2 (two) times daily., Disp: , Rfl:    glimepiride (AMARYL) 2 MG tablet, Take 1 tablet (2 mg total) by mouth daily., Disp: 90 tablet, Rfl: 0   glucose blood test strip, Use as instructed, Disp: 100 each, Rfl: 12   losartan (COZAAR) 25 MG tablet, Take 1 tablet (25 mg total) by mouth daily., Disp: 90 tablet, Rfl: 2   metroNIDAZOLE (FLAGYL) 250 MG tablet, Take 1 tablet (250 mg total) by mouth 4 (four) times daily., Disp: 54 tablet, Rfl: 0   norethindrone (MICRONOR) 0.35 MG tablet, Take 1 tablet by mouth daily., Disp: , Rfl:    pantoprazole (PROTONIX) 40 MG tablet, Take 1 tablet (40 mg total) by mouth 2 (two) times daily before a meal., Disp: 60 tablet, Rfl: 0   Semaglutide,0.25 or 0.5MG/DOS, (OZEMPIC, 0.25 OR 0.5 MG/DOSE,) 2 MG/3ML SOPN, Inject 0.5 mg into the skin once a week., Disp: 3 mL, Rfl: 1  Allergies  Allergen Reactions   Metformin Hcl Other (See Comments)   Review of Systems Objective:  There were no vitals filed for this visit.  General: Well developed, nourished, in no acute distress, alert and oriented x3   Dermatological: Skin is warm, dry and supple bilateral. Nails x 10 are well maintained; remaining  integument appears unremarkable at this time. There are no open sores, no preulcerative lesions, no rash or signs of infection present.  There appears to be a small papule just distal to the medial longitudinal arch centrally located in the foot.  It appears to dried over and scabbed but beneath it there appears to be purulence.  I was able to be refit and there is a consider amount of purulence with a sample for culture and sensitivity and Gram stain.  I did not find any type of foreign body.  Vascular: Dorsalis Pedis artery and Posterior Tibial artery pedal pulses are 2/4 bilateral with immedate capillary fill time. Pedal hair growth present. No varicosities and no lower extremity edema present  bilateral.   Neruologic: Grossly intact via light touch bilateral. Vibratory intact via tuning fork bilateral. Protective threshold with Semmes Wienstein monofilament intact to all pedal sites bilateral. Patellar and Achilles deep tendon reflexes 2+ bilateral. No Babinski or clonus noted bilateral.   Musculoskeletal: No gross boney pedal deformities bilateral. No pain, crepitus, or limitation noted with foot and ankle range of motion bilateral. Muscular strength 5/5 in all groups tested bilateral.  Gait: Unassisted, Nonantalgic.    Radiographs:  Radiographs taken today with a marker located in the plantar aspect of the forefoot does not demonstrate any type of osseous abnormality in the forefoot nor does it demonstrate any retained foreign body no gas present.  Assessment & Plan:   Assessment: Abscess forefoot right.  Plan: An incision and drainage was performed today after Betadine skin prep.  There was no local used.  The area was flushed once all of the purulence was removed.  This remained tender to her however felt much better after the pressure was removed.  I am going to go ahead and start her on Augmentin and I did recommend Epsom salts and warm water soaks.  She will start her Augmentin 875 1 twice daily call with questions or concerns regarding this.  I will not be back in the [place] office until Wednesday of next week I would like for her to follow-up with Dr. Sherryle Lis next Monday to make sure that she is doing well and that she does not need further surgical consideration.  We did discuss the signs and symptoms of infection systemic infection she understands this and is amenable to it should any of the symptoms develop she will go immediately to the emergency department.     Shameka Aggarwal T. [place], [place]

## 2022-05-31 LAB — WOUND CULTURE: Organism ID, Bacteria: NONE SEEN

## 2022-06-02 ENCOUNTER — Ambulatory Visit: Payer: No Typology Code available for payment source | Admitting: Nurse Practitioner

## 2022-06-05 ENCOUNTER — Ambulatory Visit: Payer: No Typology Code available for payment source | Admitting: Podiatry

## 2022-06-05 DIAGNOSIS — S90851A Superficial foreign body, right foot, initial encounter: Secondary | ICD-10-CM

## 2022-06-05 NOTE — Patient Instructions (Signed)
Look for urea 40% cream or ointment and apply to the thickened dry skin / calluses. This can be bought over the counter, at a pharmacy or online such as [employer].  

## 2022-06-06 NOTE — Progress Notes (Signed)
  Subjective:  Patient ID: Wendy Colon, female    DOB: Mar 10, 1976,  MRN: [id number]  Chief Complaint  Patient presents with   Callouses    With abscess, taking antibiotics as ordered    46 y.o. female presents with the above complaint. History confirmed with patient.  She saw Dr. Milinda Pointer last week for a callus/abscess on the right foot.  She has been taking the antibiotics and feels like it has improved some  Objective:  Physical Exam: warm, good capillary refill, no trophic changes or ulcerative lesions, normal DP and PT pulses, normal sensory exam, and hyperkeratosis surrounding punctate puncture wound, debridement of the area reveals a small sharp piece of glass present in the central portion, there is no new purulence no cellulitis infection appears to be resolving.  Assessment:   1. Foreign body in right foot, initial encounter      Plan:  Patient was evaluated and treated and all questions answered.  I debrided the area using a sharp chisel blade to remove the overlying hyperkeratosis.  This allowed me to encounter the small piece of glass.  Once this was removed uneventfully I further inspected the wound there is no evidence of a deep sinus tract or further puncture wound.  Advised her to continue her Augmentin until she has completed the entire course and return as needed if it does not improve or worsens then to inspect daily.  Discussed if she has continued issues likely would recommend an ultrasound for this  Return if symptoms worsen or fail to improve.

## 2022-06-11 ENCOUNTER — Other Ambulatory Visit: Payer: Self-pay | Admitting: Physician Assistant

## 2022-06-11 DIAGNOSIS — K219 Gastro-esophageal reflux disease without esophagitis: Secondary | ICD-10-CM

## 2022-06-12 ENCOUNTER — Other Ambulatory Visit: Payer: Self-pay | Admitting: Physician Assistant

## 2022-06-12 DIAGNOSIS — K219 Gastro-esophageal reflux disease without esophagitis: Secondary | ICD-10-CM

## 2022-08-10 ENCOUNTER — Other Ambulatory Visit: Payer: Self-pay | Admitting: Nurse Practitioner

## 2022-08-10 DIAGNOSIS — K219 Gastro-esophageal reflux disease without esophagitis: Secondary | ICD-10-CM

## 2022-08-10 DIAGNOSIS — F419 Anxiety disorder, unspecified: Secondary | ICD-10-CM

## 2022-08-10 DIAGNOSIS — F331 Major depressive disorder, recurrent, moderate: Secondary | ICD-10-CM

## 2022-08-31 ENCOUNTER — Encounter: Payer: Self-pay | Admitting: Nurse Practitioner

## 2022-08-31 ENCOUNTER — Ambulatory Visit: Payer: BC Managed Care – PPO | Admitting: Nurse Practitioner

## 2022-08-31 ENCOUNTER — Telehealth (INDEPENDENT_AMBULATORY_CARE_PROVIDER_SITE_OTHER): Payer: BC Managed Care – PPO | Admitting: Nurse Practitioner

## 2022-08-31 DIAGNOSIS — I10 Essential (primary) hypertension: Secondary | ICD-10-CM

## 2022-08-31 DIAGNOSIS — N3000 Acute cystitis without hematuria: Secondary | ICD-10-CM | POA: Diagnosis not present

## 2022-08-31 DIAGNOSIS — K5900 Constipation, unspecified: Secondary | ICD-10-CM

## 2022-08-31 DIAGNOSIS — K219 Gastro-esophageal reflux disease without esophagitis: Secondary | ICD-10-CM | POA: Diagnosis not present

## 2022-08-31 DIAGNOSIS — Z794 Long term (current) use of insulin: Secondary | ICD-10-CM

## 2022-08-31 DIAGNOSIS — E1165 Type 2 diabetes mellitus with hyperglycemia: Secondary | ICD-10-CM | POA: Diagnosis not present

## 2022-08-31 MED ORDER — LOSARTAN POTASSIUM 25 MG PO TABS: 25.0000 mg | ORAL_TABLET | Freq: Every day | ORAL | 2 refills | Status: DC

## 2022-08-31 MED ORDER — PANTOPRAZOLE SODIUM 40 MG PO TBEC: 40.0000 mg | DELAYED_RELEASE_TABLET | Freq: Every day | ORAL | 2 refills | Status: DC

## 2022-08-31 MED ORDER — LINACLOTIDE 72 MCG PO CAPS: 72.0000 ug | ORAL_CAPSULE | Freq: Every day | ORAL | 3 refills | Status: DC

## 2022-08-31 MED ORDER — GLIMEPIRIDE 2 MG PO TABS: 2.0000 mg | ORAL_TABLET | Freq: Every day | ORAL | 2 refills | Status: DC

## 2022-08-31 MED ORDER — OZEMPIC (0.25 OR 0.5 MG/DOSE) 2 MG/3ML ~~LOC~~ SOPN: 0.2500 mg | PEN_INJECTOR | SUBCUTANEOUS | 0 refills | Status: DC

## 2022-08-31 MED ORDER — CIPROFLOXACIN HCL 250 MG PO TABS: 250.0000 mg | ORAL_TABLET | Freq: Two times a day (BID) | ORAL | 0 refills | Status: AC

## 2022-08-31 NOTE — Assessment & Plan Note (Signed)
Symptoms appear consistent with acute urinary tract infection, due to patient having nausea and vomiting would elect to treat with ciprofloxacin.  Prescription for Cipro 25 mg by mouth twice a day x7 days sent to pharmacy.  Patient requested to come to office to provide urine sample so we can send for urine culture.  Patient reports she will try to do so before the end of the day.

## 2022-08-31 NOTE — Assessment & Plan Note (Signed)
Chronic, patient requesting refill on losartan.  Losartan 25 mg/day sent to patient's pharmacy.

## 2022-08-31 NOTE — Progress Notes (Addendum)
Established Patient Office Visit  An audio-only tele-health visit was completed today for this patient. I connected with  Fransico Him on 08/31/22 utilizing audio-only technology and verified that I am speaking with the correct person using two identifiers. The patient was located at their hairdresser (patient felt comfortable completing visit over the phone despite being in public location, she was educated to not put me on speaker phone and to move to a private area if there are any concerns for privacy violation), and I was located at the office of [facility] at [facility] during the encounter. I discussed the limitations of evaluation and management by telemedicine. The patient expressed understanding and agreed to proceed.    Subjective   Patient ID: Wendy Colon, female    DOB: May 28, 1976  Age: 46 y.o. MRN: [id number]  Chief Complaint  Patient presents with   Urinary Tract Infection   Patient arrives for visit for the below.  Dysuria: Symptoms started 1 week ago.  Experiencing dysuria, frequency, nausea with vomiting.  Denies fever.  Reports that she thinks she has UTI.  Also experiencing urinary frequency.  Type 2 diabetes/hypertension: Last A1c 7.2 from 5 months ago.  On glimepiride 2 mg/day and Ozempic 0.5 mg/week.  Ran out of Ozempic so she is been off of this for approximately 2 weeks.  On ARB.  Not on statin therapy, last LDL less than 100.  Constipation: Reports history of chronic constipation and has used Linzess in the past.  Reports 1 bowel movement per week.  Denies blood in stool.  Has undergone evaluation with GI, did have EGD completed approximately 4 months ago and did test positive for H. pylori, has completed treatment for this.  Underwent colonoscopy recently as well which did find precancerous polyps, recommended to repeat in 5 years.     Review of Systems  Constitutional:  Negative for chills and fever.  Eyes:  Negative for blurred vision.   Cardiovascular:  Negative for chest pain.  Gastrointestinal:  Positive for vomiting. Negative for blood in stool, diarrhea and nausea.  Genitourinary:  Positive for dysuria, flank pain, frequency and hematuria.  Neurological:  Negative for headaches.      Objective:     There were no vitals taken for this visit.  No vital signs available at time of visit   Physical Exam Comprehensive physical exam not completed today as office visit was conducted remotely.  Patient sounded well over the phone.  Patient was alert and oriented, and appeared to have appropriate judgment.   No results found for any visits on 08/31/22.    The 10-year ASCVD risk score (Arnett DK, et al., 2019) is: 8.3%    Assessment & Plan:   Problem List Items Addressed This Visit       Cardiovascular and Mediastinum   Hypertension    Chronic, patient requesting refill on losartan.  Losartan 25 mg/day sent to patient's pharmacy.      Relevant Medications   losartan (COZAAR) 25 MG tablet   Other Relevant Orders   Basic metabolic panel     Digestive   Gastroesophageal reflux disease without esophagitis   Relevant Medications   pantoprazole (PROTONIX) 40 MG tablet   linaclotide (LINZESS) 72 MCG capsule     Endocrine   Hyperglycemia due to type 2 diabetes mellitus ([place])    Chronic, patient due for A1c.  Patient reports that she may be able to come back to office later today for labs to be drawn, order  A1c for lab draw.  For now patient will continue on glimepiride 2 mg/day, and will restart Ozempic at 0.25 mg/week with planned increase to 0.5 mg/week in 4 weeks.  Patient denies being sexually active currently on micronor for heavy menstrual bleeding and contraception. Educated that Ozempic is contraindicated in pregnancy so if she desires pregnancy or becomes sexually active she should actively prevent pregnancy by taking her OCP every day at the same time and consider additional method of contraception such  as condom. Patient to continue on ARB. She reports her understanding.       Relevant Medications   glimepiride (AMARYL) 2 MG tablet   Semaglutide,0.25 or 0.'5MG'$ /DOS, (OZEMPIC, 0.25 OR 0.5 MG/DOSE,) 2 MG/3ML SOPN   losartan (COZAAR) 25 MG tablet   Other Relevant Orders   Hemoglobin A2N   Basic metabolic panel     Genitourinary   Acute cystitis without hematuria - Primary    Symptoms appear consistent with acute urinary tract infection, due to patient having nausea and vomiting would elect to treat with ciprofloxacin.  Prescription for Cipro 25 mg by mouth twice a day x7 days sent to pharmacy.  Patient requested to come to office to provide urine sample so we can send for urine culture.  Patient reports she will try to do so before the end of the day.      Relevant Medications   ciprofloxacin (CIPRO) 250 MG tablet   Other Relevant Orders   Urine Culture     Other   Constipation    Chronic, will restart patient on Linzess 72 mcg by mouth daily.      Relevant Medications   linaclotide (LINZESS) 72 MCG capsule    Return in about 6 weeks (around 10/12/2022).  Total time spent the telephone today was 15 minutes and 8 seconds.   Ailene Ards, NP

## 2022-08-31 NOTE — Assessment & Plan Note (Addendum)
Chronic, patient due for A1c.  Patient reports that she may be able to come back to office later today for labs to be drawn, order A1c for lab draw.  For now patient will continue on glimepiride 2 mg/day, and will restart Ozempic at 0.25 mg/week with planned increase to 0.5 mg/week in 4 weeks.  Patient denies being sexually active currently on micronor for heavy menstrual bleeding and contraception. Educated that Ozempic is contraindicated in pregnancy so if she desires pregnancy or becomes sexually active she should actively prevent pregnancy by taking her OCP every day at the same time and consider additional method of contraception such as condom. Patient to continue on ARB. She reports her understanding.

## 2022-08-31 NOTE — Assessment & Plan Note (Signed)
Chronic, will restart patient on Linzess 72 mcg by mouth daily.

## 2022-09-14 ENCOUNTER — Other Ambulatory Visit: Payer: BC Managed Care – PPO

## 2022-09-14 ENCOUNTER — Telehealth: Payer: Self-pay | Admitting: Nurse Practitioner

## 2022-09-14 DIAGNOSIS — N3000 Acute cystitis without hematuria: Secondary | ICD-10-CM

## 2022-09-14 DIAGNOSIS — E1165 Type 2 diabetes mellitus with hyperglycemia: Secondary | ICD-10-CM

## 2022-09-14 DIAGNOSIS — I10 Essential (primary) hypertension: Secondary | ICD-10-CM

## 2022-09-14 NOTE — Telephone Encounter (Signed)
Please call patient and see if she has started the ozempic 0.'25mg'$ /week injection and how many doses she has now taken as well as how she is tolerating it. Also, please remind her to stop by the lab to have her A1C drawn. If she is tolerating the 0.'25mg'$ /week dose well she can increase to 0.'5mg'$ /week once she has completed 4 doses of the 0.'25mg'$ . Let me know what her response is and if she is tolerating it well I will send in the 0.'5mg'$ /week injection.

## 2022-09-14 NOTE — Telephone Encounter (Signed)
I was able to speak with the pt and ask her if she has started the Ozempic and the pt has stated she has not due to medication needing a PA done.  I have informed the pt that I will look into the PA and see if one has been started if it I will go ahead and submit one myself.

## 2022-09-16 LAB — URINE CULTURE: Result:: NO GROWTH

## 2022-10-13 ENCOUNTER — Ambulatory Visit: Payer: BC Managed Care – PPO | Admitting: Nurse Practitioner

## 2023-02-16 ENCOUNTER — Encounter: Payer: Self-pay | Admitting: Nurse Practitioner

## 2023-02-16 ENCOUNTER — Ambulatory Visit (INDEPENDENT_AMBULATORY_CARE_PROVIDER_SITE_OTHER): Payer: BC Managed Care – PPO | Admitting: Nurse Practitioner

## 2023-02-16 VITALS — BP 130/68 | HR 88 | Temp 98.5°F | Ht 64.0 in | Wt 233.1 lb

## 2023-02-16 DIAGNOSIS — Z1159 Encounter for screening for other viral diseases: Secondary | ICD-10-CM | POA: Insufficient documentation

## 2023-02-16 DIAGNOSIS — E1165 Type 2 diabetes mellitus with hyperglycemia: Secondary | ICD-10-CM | POA: Diagnosis not present

## 2023-02-16 DIAGNOSIS — Z794 Long term (current) use of insulin: Secondary | ICD-10-CM | POA: Diagnosis not present

## 2023-02-16 DIAGNOSIS — K5900 Constipation, unspecified: Secondary | ICD-10-CM

## 2023-02-16 DIAGNOSIS — K219 Gastro-esophageal reflux disease without esophagitis: Secondary | ICD-10-CM

## 2023-02-16 DIAGNOSIS — I10 Essential (primary) hypertension: Secondary | ICD-10-CM

## 2023-02-16 DIAGNOSIS — F32A Depression, unspecified: Secondary | ICD-10-CM

## 2023-02-16 MED ORDER — ESCITALOPRAM OXALATE 5 MG PO TABS
ORAL_TABLET | ORAL | 0 refills | Status: DC
Start: 2023-02-16 — End: 2023-03-15

## 2023-02-16 MED ORDER — LOSARTAN POTASSIUM 25 MG PO TABS
25.0000 mg | ORAL_TABLET | Freq: Every day | ORAL | 0 refills | Status: DC
Start: 2023-02-16 — End: 2023-02-20

## 2023-02-16 MED ORDER — LINACLOTIDE 72 MCG PO CAPS
72.0000 ug | ORAL_CAPSULE | Freq: Every day | ORAL | 0 refills | Status: DC
Start: 2023-02-16 — End: 2023-02-20

## 2023-02-16 MED ORDER — PANTOPRAZOLE SODIUM 40 MG PO TBEC: 40.0000 mg | DELAYED_RELEASE_TABLET | Freq: Every day | ORAL | 0 refills | Status: DC

## 2023-02-16 MED ORDER — GLIMEPIRIDE 2 MG PO TABS
2.0000 mg | ORAL_TABLET | Freq: Every day | ORAL | 0 refills | Status: DC
Start: 2023-02-16 — End: 2023-02-20

## 2023-02-16 MED ORDER — LANTUS SOLOSTAR 100 UNIT/ML ~~LOC~~ SOPN
10.0000 [IU] | PEN_INJECTOR | Freq: Every day | SUBCUTANEOUS | 2 refills | Status: DC
Start: 2023-02-16 — End: 2023-07-19

## 2023-02-16 NOTE — Progress Notes (Signed)
Established Patient Office Visit  Subjective   Patient ID: Wendy Colon, female    DOB: 1976-01-31  Age: 47 y.o. MRN: [id number]  Chief Complaint  Patient presents with   Annual Exam    Patient arrives for physical exam, however visit changed to office visit to address her chronic conditions.  Has been lost to follow-up due to loss of medical insurance, now she reports she has stable medical insurance and would like to reestablish and focus on her health.  Depression/anxiety: Mood much worse today compared to last time she was seen.  Denies suicidal ideation.  Has been on Lexapro in the past like to restart this if possible.  Type 2 diabetes/HTN: Reports fasting blood sugar has been in the 250s, reports that she has been on insulin in the past would like to restart it.  Also has been on glimepiride in the past, currently completely out of this medication.  Continues on losartan /day not on statin therapy.  She is on oral contraceptive pill for contraception.  Last A1c 7.2 (03/2022).  Constipation/Gerd: Chronic, reports still not well-controlled.  Has been on Linzess in the past like refill of this if possible.  Has difficult to control GERD, on metoprolol daily.  Had endoscopy and colonoscopy completed in 04/2022.  Polyps were identified and patient was found positive for H. pylori.  She reports she underwent treatment for H. pylori, but was unable to follow-up for test of cure.  She again cites loss of medical insurance for the reason she was unable to follow-up.    Review of Systems  Respiratory:  Negative for shortness of breath.   Cardiovascular:  Negative for chest pain.  Psychiatric/Behavioral:  Positive for depression. Negative for suicidal ideas.       Objective:     BP 130/68   Pulse 88   Temp 98.5 F (36.9 C) (Temporal)   Ht  (1.626 m)   Wt 233 lb 2 oz (105.7 kg)   SpO2 98%   BMI 40.02 kg/m       02/16/2023    4:22 PM 05/11/2022    9:12 AM 03/30/2022     1:57 PM  PHQ9 SCORE ONLY  PHQ-9 Total Score Physical Exam Vitals reviewed.  Constitutional:      General: She is not in acute distress.    Appearance: Normal appearance.  HENT:     Head: Normocephalic and atraumatic.  Neck:     Vascular: No carotid bruit.  Cardiovascular:     Rate and Rhythm: Normal rate and regular rhythm.     Pulses: Normal pulses.     Heart sounds: Normal heart sounds.  Pulmonary:     Effort: Pulmonary effort is normal.     Breath sounds: Normal breath sounds.  Skin:    General: Skin is warm and dry.  Neurological:     General: No focal deficit present.     Mental Status: She is alert and oriented to person, place, and time.  Psychiatric:        Mood and Affect: Mood normal.        Behavior: Behavior normal.        Judgment: Judgment normal.      No results found for any visits on 02/16/23.    The 10-year ASCVD risk score (Arnett DK, et al., 2019) is: 8.5%    Assessment & Plan:   Problem List Items Addressed This Visit  Cardiovascular and Mediastinum   Hypertension    Chronic, blood pressure reasonable today. Continue losartan 25 mg daily. Labs closed at time of patient's appointment, she is going to come back on Monday for lab draw.  Further recommendations may be made based upon these results.      Relevant Medications   losartan (COZAAR) 25 MG tablet   Other Relevant Orders   CBC   Comprehensive metabolic panel   Hemoglobin A1c   Lipid panel   TSH     Digestive   Gastroesophageal reflux disease without esophagitis    Patient encouraged to follow-up with gastroenterology, but will refill pantoprazole 40 mg mouth daily today.      Relevant Medications   linaclotide (LINZESS) 72 MCG capsule   pantoprazole (PROTONIX) 40 MG tablet     Endocrine   Hyperglycemia due to type 2 diabetes mellitus    Patient reports fasting blood sugars greater than 250, has not been checking postprandial blood sugars.  She  feels that her blood sugars are elevated.  Would really like to restart insulin.  Will refill glimepiride 2 mg a mouth daily, and will initiate on Lantus 10 units once a day.  Patient to follow-up on the next soonest available appointment to monitor closely.  We did discuss risk of hypoglycemia when taking soap on urea and Lantus at the same time.  Patient reports she is comfortable with checking blood sugar if she has hypoglycemic symptoms.  She has been referred to endocrinology in the past and is agreeable to see them to help manage insulin.  She was encouraged to follow-up with them as well. Labs closed at time of patient's appointment, she is going to come back on Monday for lab draw.  Further recommendations may be made based upon these results. Continue on ARB and statin therapy.      Relevant Medications   glimepiride (AMARYL) 2 MG tablet   losartan (COZAAR) 25 MG tablet   insulin glargine (LANTUS SOLOSTAR) 100 UNIT/ML Solostar Pen   Other Relevant Orders   CBC   Comprehensive metabolic panel   Hemoglobin A1c   Lipid panel   TSH   Microalbumin / creatinine urine ratio     Other   Constipation    Chronic, prescription for Linzess  68mcg/day sent to patient's pharmacy.        Relevant Medications   linaclotide (LINZESS) 72 MCG capsule   Encounter for hepatitis C screening test for low risk patient - Primary    Labs closed at time of patient's appointment, she is going to come back on Monday for lab draw.  Further recommendations may be made based upon these results.      Relevant Orders   Hepatitis C antibody   Depression    Severe, recurrent Patient reports having taken Lexapro and tolerated in the past. Lexapro 5 mg by mouth to be taken daily x 7 days then as long she tolerates it to be increased to 10 mg daily sent to patient's pharmacy.  Patient to follow-up in 4 to 6 weeks to discuss depression.      Relevant Medications   escitalopram (LEXAPRO) 5 MG tablet     Return for at soonest available .  Total time spent in this encounter was 43 minutes including face-to-face evaluation, review of previous medical records, and development/discussion of treatment plan.   Elenore Paddy, NP

## 2023-02-16 NOTE — Assessment & Plan Note (Signed)
Patient encouraged to follow-up with gastroenterology, but will refill pantoprazole 40 mg mouth daily today.

## 2023-02-16 NOTE — Assessment & Plan Note (Addendum)
Patient reports fasting blood sugars greater than 250, has not been checking postprandial blood sugars.  She feels that her blood sugars are elevated.  Would really like to restart insulin.  Will refill glimepiride 2 mg a mouth daily, and will initiate on Lantus 10 units once a day.  Patient to follow-up on the next soonest available appointment to monitor closely.  We did discuss risk of hypoglycemia when taking soap on urea and Lantus at the same time.  Patient reports she is comfortable with checking blood sugar if she has hypoglycemic symptoms.  She has been referred to endocrinology in the past and is agreeable to see them to help manage insulin.  She was encouraged to follow-up with them as well. Labs closed at time of patient's appointment, she is going to come back on Monday for lab draw.  Further recommendations may be made based upon these results. Continue on ARB and statin therapy.

## 2023-02-16 NOTE — Assessment & Plan Note (Signed)
Labs closed at time of patient's appointment, she is going to come back on Monday for lab draw.  Further recommendations may be made based upon these results.

## 2023-02-16 NOTE — Assessment & Plan Note (Signed)
Severe, recurrent Patient reports having taken Lexapro and tolerated in the past. Lexapro 5 mg by mouth to be taken daily x 7 days then as long she tolerates it to be increased to 10 mg daily sent to patient's pharmacy.  Patient to follow-up in 4 to 6 weeks to discuss depression.

## 2023-02-16 NOTE — Assessment & Plan Note (Signed)
Chronic, blood pressure reasonable today. Continue losartan 25 mg daily. Labs closed at time of patient's appointment, she is going to come back on Monday for lab draw.  Further recommendations may be made based upon these results.

## 2023-02-16 NOTE — Assessment & Plan Note (Signed)
Chronic, prescription for Linzess  86mcg/day sent to patient's pharmacy.

## 2023-02-19 LAB — TSH: TSH: 1.05 u[IU]/mL (ref 0.35–5.50)

## 2023-02-19 LAB — CBC
HCT: 36.7 % (ref 36.0–46.0)
Hemoglobin: 12.3 g/dL (ref 12.0–15.0)
MCHC: 33.5 g/dL (ref 30.0–36.0)
MCV: 87.5 fl (ref 78.0–100.0)
Platelets: 276 10*3/uL (ref 150.0–400.0)
RBC: 4.19 Mil/uL (ref 3.87–5.11)
RDW: 16.4 % — ABNORMAL HIGH (ref 11.5–15.5)
WBC: 5.4 10*3/uL (ref 4.0–10.5)

## 2023-02-19 LAB — LIPID PANEL
Cholesterol: 170 mg/dL (ref 0–200)
HDL: 64.8 mg/dL (ref >39.00)
LDL Cholesterol: 90 mg/dL (ref 0–99)
NonHDL: 104.99
Total CHOL/HDL Ratio: 3
Triglycerides: 74 mg/dL (ref 0.0–149.0)
VLDL: 14.8 mg/dL (ref 0.0–40.0)

## 2023-02-19 LAB — COMPREHENSIVE METABOLIC PANEL
ALT: 17 U/L (ref 0–35)
AST: 16 U/L (ref 0–37)
Albumin: 4.2 g/dL (ref 3.5–5.2)
Alkaline Phosphatase: 64 U/L (ref 39–117)
BUN: 8 mg/dL (ref 6–23)
CO2: 30 mEq/L (ref 19–32)
Calcium: 9.3 mg/dL (ref 8.4–10.5)
Chloride: 101 mEq/L (ref 96–112)
Creatinine, Ser: 0.87 mg/dL (ref 0.40–1.20)
GFR: 78.84 mL/min (ref >60.00)
Glucose, Bld: 85 mg/dL (ref 70–99)
Potassium: 4.5 mEq/L (ref 3.5–5.1)
Sodium: 138 mEq/L (ref 135–145)
Total Bilirubin: 0.4 mg/dL (ref 0.2–1.2)
Total Protein: 7 g/dL (ref 6.0–8.3)

## 2023-02-19 LAB — BASIC METABOLIC PANEL
BUN: 8 mg/dL (ref 6–23)
CO2: 30 mEq/L (ref 19–32)
Calcium: 9.3 mg/dL (ref 8.4–10.5)
Chloride: 101 mEq/L (ref 96–112)
Creatinine, Ser: 0.87 mg/dL (ref 0.40–1.20)
GFR: 78.84 mL/min (ref >60.00)
Glucose, Bld: 85 mg/dL (ref 70–99)
Potassium: 4.5 mEq/L (ref 3.5–5.1)
Sodium: 138 mEq/L (ref 135–145)

## 2023-02-19 LAB — HEMOGLOBIN A1C: Hgb A1c MFr Bld: 9.6 % — ABNORMAL HIGH (ref 4.6–6.5)

## 2023-02-19 LAB — MICROALBUMIN / CREATININE URINE RATIO
Creatinine,U: 151.7 mg/dL
Microalb Creat Ratio: 0.5 mg/g (ref 0.0–30.0)
Microalb, Ur: <0.7 mg/dL (ref 0.0–1.9)

## 2023-02-20 ENCOUNTER — Other Ambulatory Visit: Payer: Self-pay | Admitting: Nurse Practitioner

## 2023-02-20 DIAGNOSIS — K5900 Constipation, unspecified: Secondary | ICD-10-CM

## 2023-02-20 DIAGNOSIS — K219 Gastro-esophageal reflux disease without esophagitis: Secondary | ICD-10-CM

## 2023-02-20 DIAGNOSIS — E1165 Type 2 diabetes mellitus with hyperglycemia: Secondary | ICD-10-CM

## 2023-02-20 DIAGNOSIS — I10 Essential (primary) hypertension: Secondary | ICD-10-CM

## 2023-02-20 LAB — HEPATITIS C ANTIBODY: Hepatitis C Ab: NONREACTIVE

## 2023-02-20 MED ORDER — PANTOPRAZOLE SODIUM 40 MG PO TBEC
40.0000 mg | DELAYED_RELEASE_TABLET | Freq: Every day | ORAL | 1 refills | Status: DC
Start: 2023-02-20 — End: 2023-02-28

## 2023-02-20 MED ORDER — LOSARTAN POTASSIUM 25 MG PO TABS
25.0000 mg | ORAL_TABLET | Freq: Every day | ORAL | 1 refills | Status: DC
Start: 2023-02-20 — End: 2023-02-28

## 2023-02-20 MED ORDER — LINACLOTIDE 72 MCG PO CAPS: 72.0000 ug | ORAL_CAPSULE | Freq: Every day | ORAL | 1 refills | Status: DC

## 2023-02-20 MED ORDER — GLIMEPIRIDE 2 MG PO TABS
2.0000 mg | ORAL_TABLET | Freq: Every day | ORAL | 0 refills | Status: DC
Start: 2023-02-20 — End: 2023-02-28

## 2023-02-28 ENCOUNTER — Other Ambulatory Visit: Payer: Self-pay | Admitting: Nurse Practitioner

## 2023-02-28 DIAGNOSIS — E1165 Type 2 diabetes mellitus with hyperglycemia: Secondary | ICD-10-CM

## 2023-02-28 DIAGNOSIS — I10 Essential (primary) hypertension: Secondary | ICD-10-CM

## 2023-02-28 DIAGNOSIS — K219 Gastro-esophageal reflux disease without esophagitis: Secondary | ICD-10-CM

## 2023-03-08 ENCOUNTER — Ambulatory Visit: Payer: BC Managed Care – PPO | Admitting: Nurse Practitioner

## 2023-03-15 ENCOUNTER — Other Ambulatory Visit: Payer: Self-pay | Admitting: Nurse Practitioner

## 2023-03-15 DIAGNOSIS — F32A Depression, unspecified: Secondary | ICD-10-CM

## 2023-04-05 ENCOUNTER — Telehealth: Payer: BC Managed Care – PPO | Admitting: Emergency Medicine

## 2023-04-05 DIAGNOSIS — R197 Diarrhea, unspecified: Secondary | ICD-10-CM

## 2023-04-05 DIAGNOSIS — R112 Nausea with vomiting, unspecified: Secondary | ICD-10-CM

## 2023-04-05 MED ORDER — ONDANSETRON 4 MG PO TBDP: 4.0000 mg | ORAL_TABLET | Freq: Three times a day (TID) | ORAL | 0 refills | Status: DC | PRN

## 2023-04-05 NOTE — Progress Notes (Signed)
Virtual Visit Consent   Wendy Colon, you are scheduled for a virtual visit with a [facility] provider today. Just as with appointments in the office, your consent must be obtained to participate. Your consent will be active for this visit and any virtual visit you may have with one of our providers in the next 365 days. If you have a MyChart account, a copy of this consent can be sent to you electronically.  As this is a virtual visit, video technology does not allow for your provider to perform a traditional examination. This may limit your provider's ability to fully assess your condition. If your provider identifies any concerns that need to be evaluated in person or the need to arrange testing (such as labs, EKG, etc.), we will make arrangements to do so. Although advances in technology are sophisticated, we cannot ensure that it will always work on either your end or our end. If the connection with a video visit is poor, the visit may have to be switched to a telephone visit. With either a video or telephone visit, we are not always able to ensure that we have a secure connection.  By engaging in this virtual visit, you consent to the provision of healthcare and authorize for your insurance to be billed (if applicable) for the services provided during this visit. Depending on your insurance coverage, you may receive a charge related to this service.  I need to obtain your verbal consent now. Are you willing to proceed with your visit today? Wendy Colon has provided verbal consent on 04/05/2023 for a virtual visit (video or telephone). Roxy Horseman, PA-C  Date: 04/05/2023 11:16 AM  Virtual Visit via Video Note   I, Roxy Horseman, connected with  Wendy Colon  ([id number], 08/13/1976) on 04/05/23 at 11:15 AM EDT by a video-enabled telemedicine application and verified that I am speaking with the correct person using two identifiers.  Location: Patient: Virtual Visit Location  Patient: Home Provider: Virtual Visit Location Provider: Home Office   I discussed the limitations of evaluation and management by telemedicine and the availability of in person appointments. The patient expressed understanding and agreed to proceed.    History of Present Illness: Wendy Colon is a 47 y.o. who identifies as a female who was assigned female at birth, and is being seen today for stomach cramps.  States that she is having nausea, vomiting, diarrhea. Reports subjective fevers and chills.  States that she has been having symptoms for the past day or two.  States that she can't keep anything down.  Hasn't been able to take anything for it.  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 Use up to four times daily as directed., Disp: 1 each, Rfl: 0   Continuous Blood Gluc Receiver (FREESTYLE LIBRE 14 DAY READER) DEVI, Use to check blood sugar four times a day, Disp: 1 each, Rfl: 6   Continuous Blood Gluc Sensor (FREESTYLE LIBRE 3 SENSOR) MISC, 1 each by Does not apply route in the morning, at noon, in the evening, and at bedtime., Disp: 1 each, Rfl: 0    escitalopram (LEXAPRO) 5 MG tablet, Take 1 tablet (5 mg total) by mouth 2 (two) times daily. TAKE 2 TABLETS BY MOUTH DAILY, Disp: 60 tablet, Rfl: 3   ferrous sulfate 324 (65 Fe) MG TBEC, Take 1 tablet by mouth 2 (two) times daily., Disp: , Rfl:    glimepiride (AMARYL) 2 MG tablet, TAKE 1 TABLET(2 MG) BY MOUTH DAILY, Disp: 60 tablet, Rfl: 5   glucose blood test strip, Use as instructed, Disp: 100 each, Rfl: 12   insulin glargine (LANTUS SOLOSTAR) 100 UNIT/ML Solostar Pen, Inject 10 Units into the skin daily., Disp: 15 mL, Rfl: 2   linaclotide (LINZESS) 72 MCG capsule, Take 1 capsule (72 mcg total) by mouth daily before breakfast., Disp: 90 capsule, Rfl: 1   losartan (COZAAR) 25 MG tablet, TAKE 1 TABLET(25 MG) BY MOUTH DAILY, Disp: 30 tablet, Rfl: 5   norethindrone (MICRONOR) 0.35 MG tablet, Take 1 tablet by mouth daily., Disp: , Rfl:    pantoprazole (PROTONIX) 40 MG tablet, TAKE 1 TABLET(40 MG) BY MOUTH DAILY, Disp: 30 tablet, Rfl: 5  Observations/Objective: Patient is well-developed, well-nourished in no acute distress.  Resting comfortably at home.  Head is normocephalic, atraumatic.  No labored breathing.  Speech is clear and coherent with logical content.  Patient is alert and oriented at baseline.    Assessment and Plan: 1. Nausea vomiting and diarrhea  Meds ordered this encounter  Medications   ondansetron (ZOFRAN-ODT) 4 MG disintegrating tablet    Sig: Take 1 tablet (4 mg total) by mouth every 8 (eight) hours as needed for nausea or vomiting.    Dispense:  10 tablet    Refill:  0    Order Specific Question:   Supervising Provider    Answer:   Merrilee Jansky [id number]   No fever, bloody vomit, or bloody diarrhea.  Friend is sick with similar.  Seems consistent with gastroentertiis.  Supportive care plus zofran.  Return precautions discussed.   Follow Up Instructions: I discussed the assessment and treatment plan with the patient. The patient was provided an opportunity to  ask questions and all were answered. The patient agreed with the plan and demonstrated an understanding of the instructions.  A copy of instructions were sent to the patient via MyChart unless otherwise noted below.     The patient was advised to call back or seek an in-person evaluation if the symptoms worsen or if the condition fails to improve as anticipated.  Time:  I spent 12 minutes with the patient via telehealth technology discussing the above problems/concerns.    Roxy Horseman, PA-C

## 2023-04-05 NOTE — Patient Instructions (Signed)
Ria Clock, thank you for joining Roxy Horseman, PA-C for today's virtual visit.  While this provider is not your primary care provider (PCP), if your PCP is located in our provider database this encounter information will be shared with them immediately following your visit.   A [facility] MyChart account gives you access to today's visit and all your visits, tests, and labs performed at [facility] " click here if you don't have a [facility] MyChart account or go to [email or web address]  Consent: (Patient) Lajada Munir provided verbal consent for this virtual visit at the beginning of the encounter.  Current Medications:  Current Outpatient Medications:    ondansetron (ZOFRAN-ODT) 4 MG disintegrating tablet, Take 1 tablet (4 mg total) by mouth every 8 (eight) hours as needed for nausea or vomiting., Disp: 10 tablet, Rfl: 0   Bismuth 262 MG CHEW, Chew 524 mg by mouth in the morning, at noon, in the evening, and at bedtime., Disp: 112 tablet, Rfl: 0   blood glucose meter kit and supplies KIT, Dispense based on patient and insurance preference. Use up to four times daily as directed., Disp: 1 each, Rfl: 0   Continuous Blood Gluc Receiver (FREESTYLE LIBRE 14 DAY READER) DEVI, Use to check blood sugar four times a day, Disp: 1 each, Rfl: 6   Continuous Blood Gluc Sensor (FREESTYLE LIBRE 3 SENSOR) MISC, 1 each by Does not apply route in the morning, at noon, in the evening, and at bedtime., Disp: 1 each, Rfl: 0   escitalopram (LEXAPRO) 5 MG tablet, Take 1 tablet (5 mg total) by mouth 2 (two) times daily. TAKE 2 TABLETS BY MOUTH DAILY, Disp: 60 tablet, Rfl: 3   ferrous sulfate 324 (65 Fe) MG TBEC, Take 1 tablet by mouth 2 (two) times daily., Disp: , Rfl:    glimepiride (AMARYL) 2 MG tablet, TAKE 1 TABLET(2 MG) BY MOUTH DAILY, Disp: 60 tablet, Rfl: 5   glucose blood test strip, Use as instructed, Disp: 100 each, Rfl: 12   insulin glargine (LANTUS SOLOSTAR) 100 UNIT/ML  Solostar Pen, Inject 10 Units into the skin daily., Disp: 15 mL, Rfl: 2   linaclotide (LINZESS) 72 MCG capsule, Take 1 capsule (72 mcg total) by mouth daily before breakfast., Disp: 90 capsule, Rfl: 1   losartan (COZAAR) 25 MG tablet, TAKE 1 TABLET(25 MG) BY MOUTH DAILY, Disp: 30 tablet, Rfl: 5   norethindrone (MICRONOR) 0.35 MG tablet, Take 1 tablet by mouth daily., Disp: , Rfl:    pantoprazole (PROTONIX) 40 MG tablet, TAKE 1 TABLET(40 MG) BY MOUTH DAILY, Disp: 30 tablet, Rfl: 5   Medications ordered in this encounter:  Meds ordered this encounter  Medications   ondansetron (ZOFRAN-ODT) 4 MG disintegrating tablet    Sig: Take 1 tablet (4 mg total) by mouth every 8 (eight) hours as needed for nausea or vomiting.    Dispense:  10 tablet    Refill:  0    Order Specific Question:   Supervising Provider    Answer:   Merrilee Jansky [id number]     *If you need refills on other medications prior to your next appointment, please contact your pharmacy*  Follow-Up: Call back or seek an in-person evaluation if the symptoms worsen or if the condition fails to improve as anticipated.  [facility] [phone]  Other Instructions    If you have been instructed to have an in-person evaluation today at a local Urgent Care facility, please use the link below. It will  take you to a list of all of our available [facility], including address, phone number and hours of operation. Please do not delay care.  [facility]  If you or a family member do not have a primary care provider, use the link below to schedule a visit and establish care. When you choose a [facility] primary care physician or advanced practice provider, you gain a long-term partner in health. Find a Primary Care Provider  Learn more about [facility]'s in-office and virtual care options: [facility] - Get Care Now

## 2023-04-25 ENCOUNTER — Encounter: Payer: Self-pay | Admitting: Family Medicine

## 2023-04-25 ENCOUNTER — Ambulatory Visit (INDEPENDENT_AMBULATORY_CARE_PROVIDER_SITE_OTHER): Payer: BC Managed Care – PPO | Admitting: Family Medicine

## 2023-04-25 VITALS — BP 119/72 | HR 94 | Temp 98.1°F | Resp 16 | Ht 64.0 in | Wt 220.7 lb

## 2023-04-25 DIAGNOSIS — Z1231 Encounter for screening mammogram for malignant neoplasm of breast: Secondary | ICD-10-CM

## 2023-04-25 DIAGNOSIS — Z6837 Body mass index (BMI) 37.0-37.9, adult: Secondary | ICD-10-CM

## 2023-04-25 DIAGNOSIS — F5105 Insomnia due to other mental disorder: Secondary | ICD-10-CM

## 2023-04-25 DIAGNOSIS — E139 Other specified diabetes mellitus without complications: Secondary | ICD-10-CM

## 2023-04-25 DIAGNOSIS — Z7689 Persons encountering health services in other specified circumstances: Secondary | ICD-10-CM

## 2023-04-25 DIAGNOSIS — K5900 Constipation, unspecified: Secondary | ICD-10-CM | POA: Diagnosis not present

## 2023-04-25 DIAGNOSIS — R5382 Chronic fatigue, unspecified: Secondary | ICD-10-CM

## 2023-04-25 DIAGNOSIS — Z0000 Encounter for general adult medical examination without abnormal findings: Secondary | ICD-10-CM

## 2023-04-25 DIAGNOSIS — Z1211 Encounter for screening for malignant neoplasm of colon: Secondary | ICD-10-CM

## 2023-04-25 DIAGNOSIS — Z8619 Personal history of other infectious and parasitic diseases: Secondary | ICD-10-CM

## 2023-04-25 DIAGNOSIS — E1165 Type 2 diabetes mellitus with hyperglycemia: Secondary | ICD-10-CM | POA: Diagnosis not present

## 2023-04-25 DIAGNOSIS — Z794 Long term (current) use of insulin: Secondary | ICD-10-CM

## 2023-04-25 DIAGNOSIS — F331 Major depressive disorder, recurrent, moderate: Secondary | ICD-10-CM

## 2023-04-25 MED ORDER — TIRZEPATIDE 2.5 MG/0.5ML ~~LOC~~ SOAJ
2.5000 mg | SUBCUTANEOUS | 0 refills | Status: DC
Start: 2023-04-25 — End: 2023-06-25

## 2023-04-25 MED ORDER — FREESTYLE LIBRE 3 SENSOR MISC
1.0000 | 11 refills | Status: AC
Start: 2023-04-25 — End: ?

## 2023-04-25 MED ORDER — ROSUVASTATIN CALCIUM 10 MG PO TABS
10.0000 mg | ORAL_TABLET | Freq: Every day | ORAL | 3 refills | Status: DC
Start: 2023-04-25 — End: 2024-04-09

## 2023-04-25 MED ORDER — LINACLOTIDE 72 MCG PO CAPS
72.0000 ug | ORAL_CAPSULE | Freq: Every day | ORAL | 1 refills | Status: DC
Start: 2023-04-25 — End: 2023-06-25

## 2023-04-25 MED ORDER — ESCITALOPRAM OXALATE 10 MG PO TABS
10.0000 mg | ORAL_TABLET | Freq: Every day | ORAL | 1 refills | Status: DC
Start: 2023-04-25 — End: 2023-05-09

## 2023-04-25 MED ORDER — DOXEPIN HCL 10 MG/ML PO CONC
3.0000 mg | Freq: Every evening | ORAL | 12 refills | Status: DC | PRN
Start: 2023-04-25 — End: 2023-05-09

## 2023-04-25 NOTE — Assessment & Plan Note (Signed)
Patient did not have repeat stool test done due to change in insurance; will need to discuss doing this at next visit as we did not today.

## 2023-04-25 NOTE — Progress Notes (Signed)
I,Sulibeya S Dimas,acting as a Neurosurgeon for [employer], DO.,have documented all relevant documentation on the behalf of [employer], DO,as directed by  [employer], DO while in the presence of Ellenora Talton N Ivy Meriwether, DO.   New patient visit   Patient: Wendy Colon   DOB: 08/13/1976   47 y.o. Female  MRN: [id number] Visit Date: 04/25/2023  Today's healthcare provider: Sherlyn Hay, DO   Chief Complaint  Patient presents with   New Patient (Initial Visit)   Subjective    Wendy Colon is a 47 y.o. female who presents today as a new patient to establish care.  HPI  Transferring care from Jiles Prows, NP.  Patient is wanting to go over her diabetes, weight and insomnia.   DM - wants to swith back to ozempic d/t weight gain, sugar spikes and "trying get back healthy"  - BG - Uppers 100s to upper 200s  Insomnia - trouble sleeping a lot - lays down at 8-10pm, vascillates btwn awake and dozing through 0400 AM, sometimes goes back to sleep and sometimes she is just fully awake by then.  - Before going to sleep, sometimes cleans up, watches TV, occasionally has phone conversations, sometimes sits outside, sometimes has a bath.   - Feels predominatly awake throughout the night, and sleepy  Mammogram last year; patient is due for one and states she will call to schedule soon Colonoscopy was done last year; she is unsure when she is due for her next one.  Quit smoking this year.  The 10-year ASCVD risk score (Arnett DK, et al., 2019) is: 3.4%   Values used to calculate the score:     Age: 26 years     Sex: Female     Is Non-Hispanic African American: Yes     Diabetic: Yes     Tobacco smoker: No     Systolic Blood Pressure: 119 mmHg     Is BP treated: Yes     HDL Cholesterol: 64.8 mg/dL     Total Cholesterol: 170 mg/dL     Past Medical History:  Diagnosis Date   Anemia    Anxiety 2018   Depression    Diabetes mellitus without complication (HCC)    GERD  (gastroesophageal reflux disease) 2004   Past Surgical History:  Procedure Laterality Date   KNEE ARTHROSCOPY WITH ANTERIOR CRUCIATE LIGAMENT (ACL) REPAIR Left    Family Status  Relation Name Status   Mother  Deceased   Father  Deceased   Sister  Alive   Sister  Alive   Sister  Alive   Neg Hx  (Not Specified)   Family History  Problem Relation Age of Onset   Diabetes Mother    Hypertension Mother    Heart disease Mother    Kidney disease Father    Diabetes Father    Hypertension Father    Heart disease Father    Diabetes Sister    Diabetes Sister    Diabetes Sister    Stomach cancer Neg Hx    Colon cancer Neg Hx    Colon polyps Neg Hx    Social History   Socioeconomic History   Marital status: Divorced    Spouse name: Not on file   Number of children: 0   Years of education: Not on file   Highest education level: Not on file  Occupational History   Occupation: medical asst  Tobacco Use   Smoking status: Former    Packs/day: .  5    Types: Cigarettes   Smokeless tobacco: Never  Vaping Use   Vaping Use: Never used  Substance and Sexual Activity   Alcohol use: Not Currently    Comment: ocassional   Drug use: Never   Sexual activity: Not on file  Other Topics Concern   Not on file  Social History Narrative   Not on file   Social Determinants of Health   Financial Resource Strain: Not on file  Food Insecurity: Not on file  Transportation Needs: Not on file  Physical Activity: Not on file  Stress: Not on file  Social Connections: Not on file   Outpatient Medications Prior to Visit  Medication Sig   glimepiride (AMARYL) 2 MG tablet TAKE 1 TABLET(2 MG) BY MOUTH DAILY   insulin glargine (LANTUS SOLOSTAR) 100 UNIT/ML Solostar Pen Inject 10 Units into the skin daily.   losartan (COZAAR) 25 MG tablet TAKE 1 TABLET(25 MG) BY MOUTH DAILY   [DISCONTINUED] escitalopram (LEXAPRO) 5 MG tablet Take 1 tablet (5 mg total) by mouth 2 (two) times daily. TAKE 2 TABLETS  BY MOUTH DAILY (Patient taking differently: Take 5 mg by mouth daily.)   [DISCONTINUED] linaclotide (LINZESS) 72 MCG capsule Take 1 capsule (72 mcg total) by mouth daily before breakfast.   ferrous sulfate 324 (65 Fe) MG TBEC Take 1 tablet by mouth 2 (two) times daily.   glucose blood test strip Use as instructed   norethindrone (MICRONOR) 0.35 MG tablet Take 1 tablet by mouth daily.   pantoprazole (PROTONIX) 40 MG tablet TAKE 1 TABLET(40 MG) BY MOUTH DAILY   [DISCONTINUED] Bismuth 262 MG CHEW Chew 524 mg by mouth in the morning, at noon, in the evening, and at bedtime.   [DISCONTINUED] blood glucose meter kit and supplies KIT Dispense based on patient and insurance preference. Use up to four times daily as directed.   [DISCONTINUED] Continuous Blood Gluc Receiver (FREESTYLE LIBRE 14 DAY READER) DEVI Use to check blood sugar four times a day   [DISCONTINUED] Continuous Blood Gluc Sensor (FREESTYLE LIBRE 3 SENSOR) MISC 1 each by Does not apply route in the morning, at noon, in the evening, and at bedtime.   [DISCONTINUED] ondansetron (ZOFRAN-ODT) 4 MG disintegrating tablet Take 1 tablet (4 mg total) by mouth every 8 (eight) hours as needed for nausea or vomiting.   No facility-administered medications prior to visit.   Allergies  Allergen Reactions   Metformin Hcl Other (See Comments)    Immunization History  Administered Date(s) Administered   Influenza-Unspecified 11/19/2021   PFIZER(Purple Top)SARS-COV-2 Vaccination 06/26/2020, 07/18/2020   PPD Test 10/08/2020, 02/21/2021, 01/03/2022    Health Maintenance  Topic Date Due   FOOT EXAM  Never done   OPHTHALMOLOGY EXAM  Never done   HIV Screening  Never done   DTaP/Tdap/Td (1 - Tdap) Never done   PAP SMEAR-Modifier  Never done   COVID-19 Vaccine (3 - Pfizer risk series) 08/15/2020   INFLUENZA VACCINE  05/31/2023   HEMOGLOBIN A1C  08/21/2023   Diabetic kidney evaluation - eGFR measurement  02/19/2024   Diabetic kidney evaluation -  Urine ACR  02/19/2024   Colonoscopy  05/05/2027   Hepatitis C Screening  Completed   HPV VACCINES  Aged Out    Patient Care Team: Maninder Deboer, Monico Blitz, DO as PCP - General (Family Medicine) Ablott, Claris Gower (Optometry)  Review of Systems  Constitutional:  Positive for activity change, appetite change, diaphoresis and fatigue.  Eyes:  Positive for visual disturbance.  Gastrointestinal:  Positive for abdominal distention, abdominal pain and constipation.  Endocrine: Positive for polydipsia and polyphagia.  Genitourinary:  Positive for vaginal bleeding.  Psychiatric/Behavioral:  Positive for agitation, decreased concentration and sleep disturbance. The patient is nervous/anxious.        Objective    BP 119/72 (BP Location: Left Arm, Patient Position: Sitting, Cuff Size: Large)   Pulse 94   Temp 98.1 F (36.7 C) (Temporal)   Resp 16   Ht 5\' 4"  (1.626 m)   Wt 220 lb 11.2 oz (100.1 kg)   LMP 04/22/2023 (Exact Date)   SpO2 100%   BMI 37.88 kg/m    Physical Exam Constitutional:      Appearance: Normal appearance.  HENT:     Head: Normocephalic and atraumatic.  Eyes:     General: No scleral icterus.    Extraocular Movements: Extraocular movements intact.     Conjunctiva/sclera: Conjunctivae normal.  Cardiovascular:     Rate and Rhythm: Normal rate and regular rhythm.     Pulses: Normal pulses.     Heart sounds: Normal heart sounds.  Pulmonary:     Effort: Pulmonary effort is normal. No respiratory distress.     Breath sounds: Normal breath sounds.  Musculoskeletal:     Right lower leg: No edema.     Left lower leg: No edema.  Skin:    General: Skin is warm and dry.  Neurological:     Mental Status: She is alert and oriented to person, place, and time. Mental status is at baseline.  Psychiatric:        Mood and Affect: Mood normal.        Behavior: Behavior normal.     Depression Screen    04/25/2023    8:42 AM 02/16/2023    4:22 PM 05/11/2022    9:12 AM 03/30/2022     1:57 PM  PHQ 2/9 Scores  PHQ - 2 Score 2 4 2 5   PHQ- 9 Score 14 21 7 16    No results found for any visits on 04/25/23.  Assessment & Plan     1. Diabetes 1.5, managed as type 2 [facility]) Patient requested to return to GLP-1 medication.  Will start Mounjaro as noted below, per discussion with patient.  Will also prescribe freestyle libre 3 CGM as noted below for patient to have more accurate understanding of her blood sugar levels.  Will continue Lantus dosage until Mounjaro is titrated up.       Patient does not believe the diabetes 1.5 portion of her diagnosis is correct; she believes it was an error in her reporting.  Will request records and consider checking antibody levels to clarify this diagnosis at next visit if records and/or antibody levels are not available.  - Tirzepatide (MOUNJARO) 2.5 MG/0.5ML Pen; Inject 2.5 mg into the skin once a week.  Dispense: 2 mL; Refill: 0 - Continuous Glucose Sensor (FREESTYLE LIBRE 3 SENSOR) MISC; 1 each by Does not apply route every 14 (fourteen) days. Place 1 sensor on the skin every 14 days. Use to check glucose continuously  Dispense: 2 each; Refill: 11 - rosuvastatin (CRESTOR) 10 MG tablet; Take 1 tablet (10 mg total) by mouth daily.  Dispense: 90 tablet; Refill: 3  2. Establishing care with new doctor, encounter for Will not do blood work today as patient recently had some drawn (02/19/2023).  Will re-check on follow-up visit.  3. Chronic fatigue Does endorse chronic fatigue.  Will evaluate for metabolic causes with blood work at  follow-up visit to combine draw bloods/reduce needlesticks.  4. Colon cancer screening Patient had colonoscopy done last year due to iron deficiency anemia, which was determined to be due to H. pylori gastritis (05/04/2022).    - Patient had multiple polyps which were resected and retrieved at that time.  3 of 4 removed polyps were determined to be precancerous and she was recommended to have a repeat colonoscopy in 5  years (05/05/2027).  5. Encounter for screening mammogram for breast cancer Mammogram last year; patient is due for one and states she will call to schedule soon.  6. Type 2 diabetes mellitus with hyperglycemia, with long-term current use of insulin (HCC) Clarifying diagnosis as noted above.  7. Long-term insulin use (HCC) - Continuous Glucose Sensor (FREESTYLE LIBRE 3 SENSOR) MISC; 1 each by Does not apply route every 14 (fourteen) days. Place 1 sensor on the skin every 14 days. Use to check glucose continuously  Dispense: 2 each; Refill: 11  8. Constipation, unspecified constipation type Refill. - linaclotide (LINZESS) 72 MCG capsule; Take 1 capsule (72 mcg total) by mouth daily before breakfast.  Dispense: 90 capsule; Refill: 1  9. Insomnia disorder, with non-sleep disorder mental comorbidity, recurrent She does have significant difficulty staying asleep, though she will doze off.  Her Epworth sleep scale score is less than 7 not exhibit excessive daytime sleepiness.  Will go ahead and prescribe low-dose doxepin as noted below to facilitate better sleeping habits.  Patient did note that she was previously on Ambien.  However, I discussed that I usually do not prescribe Ambien if it can be avoided due to its high addiction/dependence potential, she stated she understood and was comfortable with avoiding if possible. - doxepin (SINEQUAN) 10 MG/ML solution; Take 0.3 mLs (3 mg total) by mouth at bedtime as needed and may repeat dose one time if needed for sleep.  Dispense: 120 mL; Refill: 12  10. Moderate episode of recurrent major depressive disorder (HCC) Continues to experience significant depression which she believes is also interfering with her sleep.  Will increase her escitalopram today. - escitalopram (LEXAPRO) 10 MG tablet; Take 1 tablet (10 mg total) by mouth daily.  Dispense: 30 tablet; Refill: 1  11. History of Helicobacter pylori infection Patient did not have repeat stool test  done due to change in insurance; will need to discuss doing this at next visit as we did not today.   12. Class 2 severe obesity due to excess calories with serious comorbidity and body mass index (BMI) of 37.0 to 37.9 in adult [facility]) Patient continues to work toward weight loss.  Prescribed Mounjaro to address her diabetes and facilitate weight loss at today's visit.  Will follow-up at next visit.     There are other unrelated non-urgent complaints, but due to the busy schedule and the amount of time I've already spent with her, time does not permit me to address these routine issues at today's visit. I've requested another appointment to review these additional issues.  Total time was 85 minutes. That includes chart review before the visit, the actual patient visit, and time spent on documentation after the visit.     Return in about 26 days (around 05/21/2023) for DM/routine.     The entirety of the information documented in the History of Present Illness, Review of Systems and Physical Exam were personally obtained by me. Portions of this information were initially documented by the CMA, Hyacinth Meeker, and reviewed by me for thoroughness and accuracy.  I discussed the assessment and treatment plan with the patient  The patient was provided an opportunity to ask questions and all were answered. The patient agreed with the plan and demonstrated an understanding of the instructions.   The patient was advised to call back or seek an in-person evaluation if the symptoms worsen or if the condition fails to improve as anticipated.    Sherlyn Hay, DO  [facility] [phone] (phone) [phone] (fax)  [facility]

## 2023-05-01 ENCOUNTER — Encounter: Payer: Self-pay | Admitting: Family Medicine

## 2023-05-08 ENCOUNTER — Ambulatory Visit: Payer: Self-pay

## 2023-05-08 NOTE — Telephone Encounter (Signed)
     Chief Complaint: Pt. Started Mounjaro Sunday and states Monday started with headache, chills, nausea, weakness. Appointment tomorrow, but asking to be worked in today if possible. Symptoms: Above Frequency: Monday Pertinent Negatives: Patient denies any other symptoms Disposition: [] ED /[] Urgent Care (no appt availability in office) / [x] Appointment(In office/virtual)/ []  [facility]/ [] Home Care/ [] Refused Recommended Disposition /[] [facility]/ []  Follow-up with PCP Additional Notes: Please advise pt.  Reason for Disposition  [1] Caller has URGENT medicine question about med that PCP or specialist prescribed AND [2] triager unable to answer question  Answer Assessment - Initial Assessment Questions 1. NAME of MEDICINE: "What medicine(s) are you calling about?"     Mounjaro 2. QUESTION: "What is your question?" (e.g., double dose of medicine, side effect)     Causing nausea, headache, weak, hot and cold 3. PRESCRIBER: "Who prescribed the medicine?" Reason: if prescribed by specialist, call should be referred to that group.     Dr. Payton Mccallum 4. SYMPTOMS: "Do you have any symptoms?" If Yes, ask: "What symptoms are you having?"  "How bad are the symptoms (e.g., mild, moderate, severe)     Above 5. PREGNANCY:  "Is there any chance that you are pregnant?" "When was your last menstrual period?"     No  Protocols used: Medication Question Call-A-AH

## 2023-05-09 ENCOUNTER — Ambulatory Visit: Payer: BC Managed Care – PPO | Admitting: Family Medicine

## 2023-05-09 ENCOUNTER — Ambulatory Visit
Admission: RE | Admit: 2023-05-09 | Discharge: 2023-05-09 | Disposition: A | Discharge status: Home / Self Care | Payer: BC Managed Care – PPO | Source: Ambulatory Visit | Attending: Family Medicine | Admitting: Family Medicine

## 2023-05-09 ENCOUNTER — Encounter: Payer: Self-pay | Admitting: Family Medicine

## 2023-05-09 ENCOUNTER — Ambulatory Visit
Admission: RE | Admit: 2023-05-09 | Discharge: 2023-05-09 | Disposition: A | Discharge status: Home / Self Care | Payer: BC Managed Care – PPO | Attending: Family Medicine | Admitting: Family Medicine

## 2023-05-09 VITALS — BP 151/88 | HR 77 | Temp 98.3°F | Resp 13 | Ht 64.0 in | Wt 221.6 lb

## 2023-05-09 DIAGNOSIS — S99921A Unspecified injury of right foot, initial encounter: Secondary | ICD-10-CM

## 2023-05-09 DIAGNOSIS — T50905A Adverse effect of unspecified drugs, medicaments and biological substances, initial encounter: Secondary | ICD-10-CM | POA: Diagnosis not present

## 2023-05-09 DIAGNOSIS — F331 Major depressive disorder, recurrent, moderate: Secondary | ICD-10-CM

## 2023-05-09 DIAGNOSIS — S92411A Displaced fracture of proximal phalanx of right great toe, initial encounter for closed fracture: Secondary | ICD-10-CM

## 2023-05-09 DIAGNOSIS — R109 Unspecified abdominal pain: Secondary | ICD-10-CM | POA: Diagnosis not present

## 2023-05-09 DIAGNOSIS — E139 Other specified diabetes mellitus without complications: Secondary | ICD-10-CM

## 2023-05-09 DIAGNOSIS — Z8619 Personal history of other infectious and parasitic diseases: Secondary | ICD-10-CM

## 2023-05-09 DIAGNOSIS — Z7985 Long-term (current) use of injectable non-insulin antidiabetic drugs: Secondary | ICD-10-CM

## 2023-05-09 LAB — POCT URINALYSIS DIPSTICK
Bilirubin, UA: NEGATIVE
Blood, UA: NEGATIVE
Glucose, UA: NEGATIVE
Ketones, UA: NEGATIVE
Leukocytes, UA: NEGATIVE
Nitrite, UA: NEGATIVE
Protein, UA: POSITIVE — AB
Spec Grav, UA: 1.01 (ref 1.010–1.025)
Urobilinogen, UA: 0.2 E.U./dL
pH, UA: 7.5 (ref 5.0–8.0)

## 2023-05-09 MED ORDER — DULOXETINE HCL 30 MG PO CPEP
30.0000 mg | ORAL_CAPSULE | Freq: Every day | ORAL | 0 refills | Status: DC
Start: 2023-05-09 — End: 2023-06-25

## 2023-05-09 MED ORDER — NAPROXEN 500 MG PO TABS
500.0000 mg | ORAL_TABLET | Freq: Two times a day (BID) | ORAL | 0 refills | Status: DC
Start: 2023-05-09 — End: 2023-06-25

## 2023-05-09 NOTE — Progress Notes (Signed)
Established patient visit   Patient: Wendy Colon   DOB: Mar 23, 1976   47 y.o. Female  MRN: [id number] Visit Date: 05/09/2023  Today's healthcare provider: Sherlyn Hay, DO   Chief Complaint  Patient presents with   Medication Reaction    States is having reaction since she took mounjaro shot. Has fatigue, fell this morning and believes her toe could be broken from the fall. also at times is hot or cold, back pain, nausea, headaches. Patient reports to feel very weak and not good.   Toe Injury    Fell this morning   Subjective    HPI HPI     Medication Reaction    Additional comments: States is having reaction since she took mounjaro shot. Has fatigue, fell this morning and believes her toe could be broken from the fall. also at times is hot or cold, back pain, nausea, headaches. Patient reports to feel very weak and not good.        Toe Injury    Additional comments: Larey Seat this morning      Last edited by Sherlyn Hay, DO on 05/09/2023  9:04 AM.      F/u mounjaro as noted above.  -Patient reports previous poor response to byetta - a lot GI problems then as well  -Continues to have persistent variation between hot and cold. Temp not checked at home  -Experiencing general malaise  Hx anemia: Taking iron, general cold feeling improving up to now.  Depression: - Doxepin causing excessive grogginess in the morning - took twice then stopped - Escitalopram causes her to feel very awake; stopped 3 days ago - Previously tried Wellbutrin - did not work - Also previously tried duloxetine; however, she decided to wean herself off of the medication over the course of approximately a week.  She states that she had a lot going on her life and was trying to figure out if it was the depression, her, or her significant other at the time.  She states she had "brain shocks" when she turned her head, after she had stopped completely; otherwise it did seem to work for  her.  Patient reports that she was feeling very groggy this morning and fell.  She believes she broke her right side great toe.  It is very painful for her.   Medications: Outpatient Medications Prior to Visit  Medication Sig   Continuous Glucose Sensor (FREESTYLE LIBRE 3 SENSOR) MISC 1 each by Does not apply route every 14 (fourteen) days. Place 1 sensor on the skin every 14 days. Use to check glucose continuously   ferrous sulfate 324 (65 Fe) MG TBEC Take 1 tablet by mouth 2 (two) times daily.   glimepiride (AMARYL) 2 MG tablet TAKE 1 TABLET(2 MG) BY MOUTH DAILY   glucose blood test strip Use as instructed   insulin glargine (LANTUS SOLOSTAR) 100 UNIT/ML Solostar Pen Inject 10 Units into the skin daily.   linaclotide (LINZESS) 72 MCG capsule Take 1 capsule (72 mcg total) by mouth daily before breakfast.   losartan (COZAAR) 25 MG tablet TAKE 1 TABLET(25 MG) BY MOUTH DAILY   norethindrone (MICRONOR) 0.35 MG tablet Take 1 tablet by mouth daily.   pantoprazole (PROTONIX) 40 MG tablet TAKE 1 TABLET(40 MG) BY MOUTH DAILY   rosuvastatin (CRESTOR) 10 MG tablet Take 1 tablet (10 mg total) by mouth daily.   tirzepatide [facility]) 2.5 MG/0.5ML Pen Inject 2.5 mg into the skin once a week.   [DISCONTINUED]  doxepin (SINEQUAN) 10 MG/ML solution Take 0.3 mLs (3 mg total) by mouth at bedtime as needed and may repeat dose one time if needed for sleep.   [DISCONTINUED] escitalopram (LEXAPRO) 10 MG tablet Take 1 tablet (10 mg total) by mouth daily.   No facility-administered medications prior to visit.    Review of Systems  Constitutional:        +malaise +alternating hot/cold sensation  Gastrointestinal:  Positive for nausea.  Genitourinary:  Positive for flank pain (right side) and frequency (6x overnight). Negative for decreased urine volume, difficulty urinating, dysuria and urgency.  Musculoskeletal:        +1st R toe pain  Skin:  Negative for color change and rash.       Objective    BP  (!) 151/88 (BP Location: Left Arm, Patient Position: Sitting, Cuff Size: Large)   Pulse 77   Temp 98.3 F (36.8 C) (Oral)   Resp 13   Ht 5\' 4"  (1.626 m)   Wt 221 lb 9.6 oz (100.5 kg)   LMP 04/22/2023 (Exact Date)   SpO2 98%   BMI 38.04 kg/m    Physical Exam Constitutional:      Appearance: Normal appearance.  HENT:     Head: Normocephalic and atraumatic.  Eyes:     General: No scleral icterus.    Extraocular Movements: Extraocular movements intact.     Conjunctiva/sclera: Conjunctivae normal.  Cardiovascular:     Rate and Rhythm: Normal rate and regular rhythm.     Pulses: Normal pulses.     Heart sounds: Normal heart sounds.  Pulmonary:     Effort: Pulmonary effort is normal. No respiratory distress.     Breath sounds: Normal breath sounds.  Abdominal:     General: Bowel sounds are normal. There is no distension.     Palpations: Abdomen is soft. There is no mass.     Tenderness: There is no abdominal tenderness. There is no guarding.     Comments: Notes tenderness of left  flank when Lloyd's sign checked, but it is not severe.  Musculoskeletal:     Right lower leg: No edema.     Left lower leg: No edema.       Feet:  Feet:     Comments: Swelling of right 1st toe Skin:    General: Skin is warm and dry.  Neurological:     Mental Status: She is alert and oriented to person, place, and time. Mental status is at baseline.  Psychiatric:        Mood and Affect: Mood normal.        Behavior: Behavior normal.     Results for orders placed or performed in visit on 05/09/23  POCT urinalysis dipstick  Result Value Ref Range   Color, UA Dark Yellow    Clarity, UA Clear    Glucose, UA Negative Negative   Bilirubin, UA Negative    Ketones, UA Negative    Spec Grav, UA 1.010 1.010 - 1.025   Blood, UA Negative    pH, UA 7.5 5.0 - 8.0   Protein, UA Positive (A) Negative   Urobilinogen, UA 0.2 0.2 or 1.0 E.U./dL   Nitrite, UA Negative    Leukocytes, UA Negative Negative    Appearance Clear    Odor      Assessment & Plan    1. Toe injury, right, initial encounter Patient suffered injury of toe this morning with significant associated pain.  Will evaluate with x-rays as  noted below.  Advised patient to start naproxen twice daily for the next 5 to 7 days.  Advised her to stop naproxen at day 5 if she is feeling significantly improved.     -X-ray demonstrated minimally displaced fracture of proximal right great toe phalanx.  Given her history of diabetes with associated hyperglycemia, will go ahead and refer to orthopedics as noted below.  Called the patient and discussed this result with her as well as the plan to refer her; she will be going to the [facility] walk-in clinic today. - DG Toe Great Right; Future - DG Foot Complete Right; Future - naproxen (NAPROSYN) 500 MG tablet; Take 1 tablet (500 mg total) by mouth 2 (two) times daily with a meal.  Dispense: 14 tablet; Refill: 0 - Ambulatory referral to Orthopedic Surgery  2. Acute left flank pain Point-of-care urinalysis demonstrates only protein in the urine.  Will send for microscopic analysis to evaluate for casts or other components which may suggest a more significant kidney problem, as her symptoms that she relates to the [facility] are fairly uncharacteristic of typical adverse symptoms. - POCT urinalysis dipstick - Urinalysis, Routine w reflex microscopic  3. History of Helicobacter pylori infection Patient was never tested for cure of Helicobacter pylori infection that caused her anemia previously.  Will go ahead and recheck today. - H. pylori antigen, stool; Future  4. Drug side effects, initial encounter Patient is experiencing significant discomfort as a side effect of the Mounjaro injection she started, including weakness and flank pain.  Will check UA for evidence of more significant kidney problem. - Urinalysis, Routine w reflex microscopic  5. Moderate episode of recurrent major depressive  disorder (HCC) Patient did not do well with the doxepin due to experiencing significant fatigue and stopped it fairly early.  Patient also stopped her escitalopram due to it creating increased difficulty with sleeping.  Will try duloxetine, which patient has previously had significant success with, as noted below.  Discussed with patient that she may increase to 2 capsules after 1 week if she is not deriving enough benefit from the single capsule.  Patient to inform clinic if she does the increased dose, so I may refill it at the higher level.  Counseled patient regarding need for prolonged tapering period she does wish to come off of it again in the future to avoid the side effect she experienced the last time. - DULoxetine (CYMBALTA) 30 MG capsule; Take 1 capsule (30 mg total) by mouth daily.  Dispense: 90 capsule; Refill: 0  6. Diabetes 1.5, managed as type 2 (HCC) Will stop Mounjaro for the time being and resume her basal insulin dosing. - Ambulatory referral to diabetic education  7. Closed displaced fracture of proximal phalanx of right great toe, initial encounter Given patient's history of diabetes and the displacement of the fracture, went ahead and referred patient to orthopedics for specialized assessment and potential treatment options.  Advised patient to keep her toes buddy taped with cushioning in between the first and second toe in the meantime.  Also encouraged her to go ahead and go to the orthopedics walk-in clinic for more immediate care.    Return in about 4 weeks (around 06/06/2023) for 4-6 weeks .       The entirety of the information documented in the History of Present Illness, Review of Systems and Physical Exam were personally obtained by me. Portions of this information were initially documented by the CMA,Sha'taria Chales Abrahams, and reviewed by me for thoroughness  and accuracy.   I discussed the assessment and treatment plan with the patient  The patient was provided an  opportunity to ask questions and all were answered. The patient agreed with the plan and demonstrated an understanding of the instructions.   The patient was advised to call back or seek an in-person evaluation if the symptoms worsen or if the condition fails to improve as anticipated.  Total time was 60 minutes. That includes chart review before the visit, the actual patient visit, and time spent on documentation after the visit.    Sherlyn Hay, DO  [facility] [phone] (phone) [phone] (fax)  [facility]

## 2023-05-10 LAB — URINALYSIS, ROUTINE W REFLEX MICROSCOPIC
Bilirubin, UA: NEGATIVE
Glucose, UA: NEGATIVE
Leukocytes,UA: NEGATIVE
Nitrite, UA: NEGATIVE
RBC, UA: NEGATIVE
Specific Gravity, UA: 1.023 (ref 1.005–1.030)
Urobilinogen, Ur: 1 mg/dL (ref 0.2–1.0)
pH, UA: 7.5 (ref 5.0–7.5)

## 2023-05-13 ENCOUNTER — Other Ambulatory Visit: Payer: Self-pay | Admitting: Family Medicine

## 2023-05-13 DIAGNOSIS — S99921A Unspecified injury of right foot, initial encounter: Secondary | ICD-10-CM

## 2023-05-23 ENCOUNTER — Other Ambulatory Visit: Payer: Self-pay | Admitting: Obstetrics and Gynecology

## 2023-05-23 DIAGNOSIS — R928 Other abnormal and inconclusive findings on diagnostic imaging of breast: Secondary | ICD-10-CM

## 2023-05-24 ENCOUNTER — Ambulatory Visit: Payer: BC Managed Care – PPO | Admitting: Dietician

## 2023-05-30 ENCOUNTER — Ambulatory Visit: Admission: RE | Admit: 2023-05-30 | Payer: BC Managed Care – PPO | Source: Ambulatory Visit

## 2023-05-30 ENCOUNTER — Ambulatory Visit
Admission: RE | Admit: 2023-05-30 | Discharge: 2023-05-30 | Disposition: A | Discharge status: Home / Self Care | Payer: BC Managed Care – PPO | Source: Ambulatory Visit | Attending: Obstetrics and Gynecology | Admitting: Obstetrics and Gynecology

## 2023-05-30 ENCOUNTER — Other Ambulatory Visit: Payer: Self-pay | Admitting: Obstetrics and Gynecology

## 2023-05-30 DIAGNOSIS — R928 Other abnormal and inconclusive findings on diagnostic imaging of breast: Secondary | ICD-10-CM

## 2023-05-30 DIAGNOSIS — N6310 Unspecified lump in the right breast, unspecified quadrant: Secondary | ICD-10-CM

## 2023-06-04 ENCOUNTER — Ambulatory Visit
Admission: RE | Admit: 2023-06-04 | Discharge: 2023-06-04 | Disposition: A | Discharge status: Home / Self Care | Payer: BC Managed Care – PPO | Source: Ambulatory Visit | Attending: Obstetrics and Gynecology | Admitting: Obstetrics and Gynecology

## 2023-06-04 DIAGNOSIS — N6310 Unspecified lump in the right breast, unspecified quadrant: Secondary | ICD-10-CM

## 2023-06-04 HISTORY — PX: BREAST BIOPSY: SHX20

## 2023-06-07 ENCOUNTER — Ambulatory Visit: Payer: BC Managed Care – PPO | Admitting: Dietician

## 2023-06-25 ENCOUNTER — Telehealth: Payer: Self-pay | Admitting: Family Medicine

## 2023-06-25 ENCOUNTER — Ambulatory Visit: Payer: BC Managed Care – PPO | Admitting: Family Medicine

## 2023-06-25 ENCOUNTER — Encounter: Payer: Self-pay | Admitting: Family Medicine

## 2023-06-25 VITALS — BP 115/72 | HR 86 | Ht 64.0 in | Wt 220.0 lb

## 2023-06-25 DIAGNOSIS — F411 Generalized anxiety disorder: Secondary | ICD-10-CM

## 2023-06-25 DIAGNOSIS — E1165 Type 2 diabetes mellitus with hyperglycemia: Secondary | ICD-10-CM

## 2023-06-25 DIAGNOSIS — F331 Major depressive disorder, recurrent, moderate: Secondary | ICD-10-CM | POA: Diagnosis not present

## 2023-06-25 DIAGNOSIS — K5900 Constipation, unspecified: Secondary | ICD-10-CM | POA: Diagnosis not present

## 2023-06-25 DIAGNOSIS — I10 Essential (primary) hypertension: Secondary | ICD-10-CM

## 2023-06-25 DIAGNOSIS — Z794 Long term (current) use of insulin: Secondary | ICD-10-CM

## 2023-06-25 MED ORDER — DULOXETINE HCL 30 MG PO CPEP
30.0000 mg | ORAL_CAPSULE | Freq: Two times a day (BID) | ORAL | 1 refills | Status: DC
Start: 2023-06-25 — End: 2023-07-23

## 2023-06-25 MED ORDER — LOSARTAN POTASSIUM 25 MG PO TABS
25.0000 mg | ORAL_TABLET | Freq: Every day | ORAL | 1 refills | Status: DC
Start: 2023-06-25 — End: 2024-04-09

## 2023-06-25 MED ORDER — LINACLOTIDE 72 MCG PO CAPS
72.0000 ug | ORAL_CAPSULE | Freq: Every day | ORAL | 1 refills | Status: DC
Start: 2023-06-25 — End: 2024-04-09

## 2023-06-25 MED ORDER — TIRZEPATIDE 5 MG/0.5ML ~~LOC~~ SOAJ
5.0000 mg | SUBCUTANEOUS | 0 refills | Status: DC
Start: 2023-06-25 — End: 2024-06-12

## 2023-06-25 MED ORDER — GLIMEPIRIDE 2 MG PO TABS
2.0000 mg | ORAL_TABLET | Freq: Two times a day (BID) | ORAL | 1 refills | Status: DC
Start: 2023-06-25 — End: 2024-04-01

## 2023-06-25 MED ORDER — GLUCOSE BLOOD VI STRP
ORAL_STRIP | 12 refills | Status: DC
Start: 2023-06-25 — End: 2024-04-03

## 2023-06-25 NOTE — Assessment & Plan Note (Addendum)
Hypertension well-controlled today.  Refilled/continued her losartan 25 mg daily.

## 2023-06-25 NOTE — Assessment & Plan Note (Addendum)
Addressed as noted under depression.

## 2023-06-25 NOTE — Assessment & Plan Note (Signed)
Refill

## 2023-06-25 NOTE — Assessment & Plan Note (Deleted)
Patient is just within a reasonable period to take her next injection of Mounjaro.  Will go ahead and send the next dosage as noted below.  Will send 90-day prescription due to patient's insurance concerns, as this will allow more consistent management of her diabetes.  Based on patient preference, will check her A1c at her next visit, along with her other lab work.  Patient will be following up for nutrition appointment and will request assistance with applying the freestyle libre there to allow for better management of her blood sugars and better adherence to a low carb diet.

## 2023-06-25 NOTE — Assessment & Plan Note (Signed)
Patient is just within a reasonable period to take her next injection of Mounjaro.  Will go ahead and send the next dosage as noted below.  Will send 90-day prescription due to patient's insurance concerns, as this will allow more consistent management of her diabetes.  Based on patient preference, will check her A1c at her next visit, along with her other lab work.  Patient will be following up for nutrition appointment and will request assistance with applying the freestyle libre there to allow for better management of her blood sugars and better adherence to a low carb diet.

## 2023-06-25 NOTE — Progress Notes (Signed)
Established patient visit   Patient: Wendy Colon   DOB: Apr 25, 1976   47 y.o. Female  MRN: [id number] Visit Date: 06/25/2023  Today's healthcare provider: Sherlyn Hay, DO   Chief Complaint  Patient presents with   Medical Management of Chronic Issues    Check blood sugar, get some blood work   Subjective    HPI  Diabetes Mellitus Type II, Follow-up  Lab Results  Component Value Date   HGBA1C 9.6 (H) 02/19/2023   HGBA1C 7.2 (H) 03/30/2022   HGBA1C 8.3 (H) 11/25/2021   Wt Readings from Last 3 Encounters:  06/25/23 220 lb (99.8 kg)  05/09/23 221 lb 9.6 oz (100.5 kg)  04/25/23 220 lb 11.2 oz (100.1 kg)   Last seen for diabetes 7 weeks ago.  Management since then includes restarting her Mounjaro.  Did restart her mounjaro once she was feeling better emotionally and feels she has been doing well on it.  She reports good compliance with treatment. She is not having side effects.   Symptoms: Yes fatigue (intermittent) No foot ulcerations  No appetite changes No nausea  No paresthesia of the feet  Yes polydipsia (intentional)  No polyuria No visual disturbances   No vomiting     Home blood sugar records:  Have been in the 200s  Episodes of hypoglycemia? No    Current insulin regiment: Wasn't taking it but restarted today as she took her last dose of Mounjaro 06/14/23 Most Recent Eye Exam: due Current exercise: walking she is striving to walk to places that she previously would have driven and otherwise walk more in general. Current diet habits: diabetic  Pertinent Labs: Lab Results  Component Value Date   CHOL 170 02/19/2023   HDL 64.80 02/19/2023   LDLCALC 90 02/19/2023   TRIG 74.0 02/19/2023   CHOLHDL 3 02/19/2023   Lab Results  Component Value Date   NA 138 02/19/2023   NA 138 02/19/2023   K 4.5 02/19/2023   K 4.5 02/19/2023   CREATININE 0.87 02/19/2023   CREATININE 0.87 02/19/2023   MICRALBCREAT 0.5 02/19/2023     Waiting for nutrition  appointment to be scheduled (she called and left a message).   ---------------------------------------------------------------------------------------------------   Depression/anxiety: Cymbalta has been working. Increased to 30mg  bid on Monday. - Patient requesting forms for leave of absence from work to be done, as she will otherwise lose her insurance at the end of this week.      Medications: Outpatient Medications Prior to Visit  Medication Sig   Continuous Glucose Sensor (FREESTYLE LIBRE 3 SENSOR) MISC 1 each by Does not apply route every 14 (fourteen) days. Place 1 sensor on the skin every 14 days. Use to check glucose continuously   ferrous sulfate 324 (65 Fe) MG TBEC Take 1 tablet by mouth 2 (two) times daily.   insulin glargine (LANTUS SOLOSTAR) 100 UNIT/ML Solostar Pen Inject 10 Units into the skin daily.   norethindrone (MICRONOR) 0.35 MG tablet Take 1 tablet by mouth daily.   rosuvastatin (CRESTOR) 10 MG tablet Take 1 tablet (10 mg total) by mouth daily.   [DISCONTINUED] DULoxetine (CYMBALTA) 30 MG capsule Take 1 capsule (30 mg total) by mouth daily.   [DISCONTINUED] glimepiride (AMARYL) 2 MG tablet TAKE 1 TABLET(2 MG) BY MOUTH DAILY   [DISCONTINUED] glucose blood test strip Use as instructed   [DISCONTINUED] linaclotide (LINZESS) 72 MCG capsule Take 1 capsule (72 mcg total) by mouth daily before breakfast.   [DISCONTINUED] losartan (COZAAR)  25 MG tablet TAKE 1 TABLET(25 MG) BY MOUTH DAILY   [DISCONTINUED] pantoprazole (PROTONIX) 40 MG tablet TAKE 1 TABLET(40 MG) BY MOUTH DAILY   [DISCONTINUED] tirzepatide (MOUNJARO) 2.5 MG/0.5ML Pen Inject 2.5 mg into the skin once a week.   [DISCONTINUED] naproxen (NAPROSYN) 500 MG tablet Take 1 tablet (500 mg total) by mouth 2 (two) times daily with a meal. (Patient not taking: Reported on 06/25/2023)   No facility-administered medications prior to visit.    Review of Systems  Psychiatric/Behavioral:  Positive for dysphoric mood. The  patient is nervous/anxious.   ROS otherwise as mentioned under HPI.      Objective    BP 115/72 (BP Location: Left Arm, Patient Position: Sitting, Cuff Size: Large)   Pulse 86   Ht 5\' 4"  (1.626 m)   Wt 220 lb (99.8 kg)   SpO2 100%   BMI 37.76 kg/m     Physical Exam Vitals and nursing note reviewed.  Constitutional:      General: She is not in acute distress.    Appearance: Normal appearance.  HENT:     Head: Normocephalic and atraumatic.  Eyes:     General: No scleral icterus.    Conjunctiva/sclera: Conjunctivae normal.  Cardiovascular:     Rate and Rhythm: Normal rate.  Pulmonary:     Effort: Pulmonary effort is normal.  Neurological:     Mental Status: She is alert and oriented to person, place, and time. Mental status is at baseline.  Psychiatric:        Mood and Affect: Mood normal.        Behavior: Behavior normal.    No results found for any visits on 06/25/23.  Assessment & Plan    Moderate episode of recurrent major depressive disorder [facility]) Assessment & Plan: Patient here for follow-up regarding her depression and anxiety.  She states she is doing better on the Cymbalta.  She just increased her dosage to the allowed 30 mg twice daily.  Her PHQ-9 score improved from 21 to 18.  She still is having trouble managing motivation and day-to-day life and staying focused on tasks.  Patient is agreeable to be referred to therapy.  Referral sent as noted.  Patient would prefer to do in person therapy sessions.   - Will remove her from work for a longer period in order to allow her to focus on getting back on track.  Filled out paperwork for leave of absence and faxed to her work today.  One of the forms was missing; patient called and requested that this form be faxed over.  Will fill it out upon receipt and send it in.  Will have her return in 4 weeks for follow-up.  Orders: -     DULoxetine HCl; Take 1 capsule (30 mg total) by mouth 2 (two) times daily.  Dispense: 180  capsule; Refill: 1 -     Ambulatory referral to Psychology  Generalized anxiety disorder Assessment & Plan: Addressed as noted under depression.  Orders: -     Ambulatory referral to Psychology  Type 2 diabetes mellitus with hyperglycemia, with long-term current use of insulin (HCC) Assessment & Plan: Patient is just within a reasonable period to take her next injection of Mounjaro.  Will go ahead and send the next dosage as noted below.  Will send 90-day prescription due to patient's insurance concerns, as this will allow more consistent management of her diabetes.  Based on patient preference, will check her A1c at her next  visit, along with her other lab work.  Patient will be following up for nutrition appointment and will request assistance with applying the freestyle libre there to allow for better management of her blood sugars and better adherence to a low carb diet.  Orders: -     Tirzepatide; Inject 5 mg into the skin once a week.  Dispense: 6 mL; Refill: 0 -     Glimepiride; Take 1 tablet (2 mg total) by mouth 2 (two) times daily.  Dispense: 180 tablet; Refill: 1 -     Glucose Blood; Use as instructed  Dispense: 150 each; Refill: 12  Constipation, unspecified constipation type Assessment & Plan: Refill  Orders: -     linaCLOtide; Take 1 capsule (72 mcg total) by mouth daily before breakfast.  Dispense: 90 capsule; Refill: 1  Hypertension, unspecified type Assessment & Plan: Hypertension well-controlled today.  Refilled/continued her losartan 25 mg daily.  Orders: -     Losartan Potassium; Take 1 tablet (25 mg total) by mouth daily.  Dispense: 90 tablet; Refill: 1    Return in about 4 weeks (around 07/23/2023) for Anx/Dep, DM.      I discussed the assessment and treatment plan with the patient  The patient was provided an opportunity to ask questions and all were answered. The patient agreed with the plan and demonstrated an understanding of the instructions.   The  patient was advised to call back or seek an in-person evaluation if the symptoms worsen or if the condition fails to improve as anticipated.  Total time was 75 minutes. That includes chart review before the visit, the actual patient visit, and time spent on documentation after the visit.    Sherlyn Hay, DO  [facility] [phone] (phone) [phone] (fax)  [facility]

## 2023-06-25 NOTE — Assessment & Plan Note (Addendum)
Patient here for follow-up regarding her depression and anxiety.  She states she is doing better on the Cymbalta.  She just increased her dosage to the allowed 30 mg twice daily.  Her PHQ-9 score improved from 21 to 18.  She still is having trouble managing motivation and day-to-day life and staying focused on tasks.  Patient is agreeable to be referred to therapy.  Referral sent as noted.  Patient would prefer to do in person therapy sessions.   - Will remove her from work for a longer period in order to allow her to focus on getting back on track.  Filled out paperwork for leave of absence and faxed to her work today.  One of the forms was missing; patient called and requested that this form be faxed over.  Will fill it out upon receipt and send it in.  Will have her return in 4 weeks for follow-up.

## 2023-06-25 NOTE — Assessment & Plan Note (Deleted)
Patient here for follow-up regarding her depression and anxiety.  She states she is doing better on the Cymbalta.  She just increased her dosage to the allowed 30 mg twice daily.  Her PHQ-9 score improved from 21 to 18.  She still is having trouble managing motivation and day-to-day life and staying focused on tasks.  Patient is agreeable to be referred to therapy.  Referral sent as noted.  Patient would prefer to do in person therapy sessions.  Will remove her from work for a longer period in order to allow her to focus on getting back on track.  Will have her return in 4 weeks for follow-up.

## 2023-06-25 NOTE — Telephone Encounter (Signed)
Pt is calling in because the insurance requiring a prior authorization on tirzepatide [facility]) 5 MG/0.5ML Pen [[id number]].

## 2023-06-26 ENCOUNTER — Telehealth: Payer: Self-pay

## 2023-06-26 NOTE — Telephone Encounter (Signed)
Patient needs a prior auth for [facility] please. Thank you

## 2023-06-29 NOTE — Telephone Encounter (Signed)
Britley Manansala (Key: [id number]) Mounjaro 5MG /0.5ML pen-injectors PA started. Faxed office notes to [phone].

## 2023-07-04 NOTE — Telephone Encounter (Signed)
Outcome Approved on August 30 by [facility] NCPDP 2017 Your PA request has been approved. Additional information will be provided in the approval communication. (Message 1145) Authorization Expiration Date: 06/27/2026 Drug Mounjaro 5MG /0.5ML pen-injector  Patient called and aware.

## 2023-07-04 NOTE — Telephone Encounter (Signed)
Apologies, but our team doesn't currently handle PA's for this office. Routing back, thanks.

## 2023-07-06 NOTE — Telephone Encounter (Signed)
Approved -Mounjaro From 06/29/2023 to 06/28/2026 as long the plan remains the same.

## 2023-07-19 ENCOUNTER — Ambulatory Visit: Payer: Self-pay

## 2023-07-19 ENCOUNTER — Ambulatory Visit: Payer: Self-pay | Admitting: *Deleted

## 2023-07-19 DIAGNOSIS — Z794 Long term (current) use of insulin: Secondary | ICD-10-CM

## 2023-07-19 MED ORDER — LANTUS SOLOSTAR 100 UNIT/ML ~~LOC~~ SOPN: 10.0000 [IU] | PEN_INJECTOR | Freq: Every day | SUBCUTANEOUS | 0 refills | Status: DC

## 2023-07-19 NOTE — Telephone Encounter (Signed)
  Chief Complaint: blood glucose remains elevated greater than 300 2 times in a row . Requesting insulin until mounjaro works more effectively  Symptoms: BS 337 taken within the last hour. Continues with freq. Urination , thirst, headaches. Has tried to increase water take and walk. Without relief of BS decreasing  Frequency: today  Pertinent Negatives: Patient denies chest pain no confusion no dizziness  Disposition: [] ED /[x] Urgent Care (no appt availability in office) / [] Appointment(In office/virtual)/ []  [facility]/ [] Home Care/ [x] Refused Recommended Disposition /[] [facility]/ []  Follow-up with PCP Additional Notes:   Reviewed request for additional medication has been sent to PCP. Recommended if sx worsen go to UC /ED. Patient requesting if she can get insulin sent to pharmacy unitil mournjaro works more effectively. Patient reports she will be stuck waiting in UC/ED as well as waiting for medication to be called in . Please advise.     Summary: Pt stated that she really needs some insulin   Pt stated that she spoke with [facility] Dec earlier but she really needs some insulin and she requests that a nurse return her call asap. Cb# [phone]                  Reason for Disposition  [1] Blood glucose > 300 mg/dL (09.8 mmol/L) AND [1] two or more times in a row  Answer Assessment - Initial Assessment Questions 1. BLOOD GLUCOSE: "What is your blood glucose level?"      337 2. ONSET: "When did you check the blood glucose?"     1 hour ago  3. USUAL RANGE: "What is your glucose level usually?" (e.g., usual fasting morning value, usual evening value)     Na  4. KETONES: "Do you check for ketones (urine or blood test strips)?" If Yes, ask: "What does the test show now?"      Na  5. TYPE 1 or 2:  "Do you know what type of diabetes you have?"  (e.g., Type 1, Type 2, Gestational; doesn't know)      Na  6. INSULIN: "Do you take insulin?" "What type of insulin(s)  do you use? What is the mode of delivery? (syringe, pen; injection or pump)?"      Mounjaro  7. DIABETES PILLS: "Do you take any pills for your diabetes?" If Yes, ask: "Have you missed taking any pills recently?"     Has not missed any medications recently changed insulin to mounjaro 8. OTHER SYMPTOMS: "Do you have any symptoms?" (e.g., fever, frequent urination, difficulty breathing, dizziness, weakness, vomiting)     Frequent urination, headache, "feels horrible".  9. PREGNANCY: "Is there any chance you are pregnant?" "When was your last menstrual period?"     na  Protocols used: Diabetes - High Blood Sugar-A-AH

## 2023-07-19 NOTE — Telephone Encounter (Signed)
It is hard to tell from chart review if she is supposed to be on this or not, but BG is high.  Resume lantus 10 units daily (send Rx for 5 pens and 0 refills) and then f/u with PCP next week.

## 2023-07-19 NOTE — Telephone Encounter (Signed)
Yes it is fine to be with another provider if needed

## 2023-07-19 NOTE — Telephone Encounter (Signed)
  Chief Complaint: BS [phone] BS fluctuations Symptoms: thirsty urinary frequency dry mouth Frequency: yesterday Pertinent Negatives: Patient denies abd pain, confusion, dizziness Disposition: [] ED /[] Urgent Care (no appt availability in office) / [] Appointment(In office/virtual)/ []  [facility]/ [] Home Care/ [] Refused Recommended Disposition /[] [facility]/ [x]  Follow-up with PCP Additional Notes: pt wanting to nknow if Lantus was dc's because she is not taking it/aklso could not find virtual appt . Pt stated if she could be placed on insulin and will come in next week Reason for Disposition  [1] Blood glucose > 300 mg/dL (01.0 mmol/L) AND [2] two or more times in a row  Answer Assessment - Initial Assessment Questions 1. BLOOD GLUCOSE: "What is your blood glucose level?"      229 273 280  yest 197 203 193 (17)      9/15/341 9/14 283 253 2. ONSET: "When did you check the blood glucose?"     Yesterday  5. TYPE 1 or 2:  "Do you know what type of diabetes you have?"  (e.g., Type 1, Type 2, Gestational; doesn't know)      Type 2  6. INSULIN: "Do you take insulin?" "What type of insulin(s) do you use? What is the mode of delivery? (syringe, pen; injection or pump)?"      Mounjaro   7. DIABETES PILLS: "Do you take any pills for your diabetes?" If Yes, ask: "Have you missed taking any pills recently?"     Yes Glimepride 8. OTHER SYMPTOMS: "Do you have any symptoms?" (e.g., fever, frequent urination, difficulty breathing, dizziness, weakness, vomiting)     Sweating, frequent urination, mouth dry at times and is drinking a lot of fluid  9. PREGNANCY: "Is there any chance you are pregnant?" "When was your last menstrual period?"     N/a  Protocols used: Diabetes - High Blood Sugar-A-AH

## 2023-07-19 NOTE — Addendum Note (Signed)
Addended by: Lily Kocher on: 07/19/2023 04:16 PM   Modules accepted: Orders

## 2023-07-19 NOTE — Telephone Encounter (Signed)
Please review and advise.

## 2023-07-19 NOTE — Telephone Encounter (Signed)
Patient scheduled.

## 2023-07-20 NOTE — Telephone Encounter (Signed)
Please clarify. Lantus insulin was prescribed Yesterday. Is she taking that? Is she requesting a rapid acting insulin?

## 2023-07-20 NOTE — Telephone Encounter (Signed)
Patient has only taken 1 dose of the Lantus so far.  She said her glucose went from the 337 to 220 something after taking it last night.  I asked her to give the Lantus a few days to see how it does before adding a fast acting insulin and she said she already had an appointment on Monday and would keep it so we could see how she does on the Lantus

## 2023-07-23 ENCOUNTER — Telehealth: Payer: Self-pay | Admitting: Family Medicine

## 2023-07-23 ENCOUNTER — Ambulatory Visit (INDEPENDENT_AMBULATORY_CARE_PROVIDER_SITE_OTHER): Payer: BC Managed Care – PPO | Admitting: Family Medicine

## 2023-07-23 ENCOUNTER — Encounter: Payer: Self-pay | Admitting: Family Medicine

## 2023-07-23 VITALS — BP 125/81 | HR 99 | Ht 64.0 in | Wt 216.0 lb

## 2023-07-23 DIAGNOSIS — F331 Major depressive disorder, recurrent, moderate: Secondary | ICD-10-CM

## 2023-07-23 DIAGNOSIS — F411 Generalized anxiety disorder: Secondary | ICD-10-CM

## 2023-07-23 DIAGNOSIS — E1165 Type 2 diabetes mellitus with hyperglycemia: Secondary | ICD-10-CM

## 2023-07-23 DIAGNOSIS — G4709 Other insomnia: Secondary | ICD-10-CM | POA: Diagnosis not present

## 2023-07-23 DIAGNOSIS — Z794 Long term (current) use of insulin: Secondary | ICD-10-CM

## 2023-07-23 LAB — GLUCOSE, POCT (MANUAL RESULT ENTRY): POC Glucose: 154 mg/dl — AB (ref 70–99)

## 2023-07-23 LAB — POCT GLYCOSYLATED HEMOGLOBIN (HGB A1C): Hemoglobin A1C: 9.9 % — AB (ref 4.0–5.6)

## 2023-07-23 MED ORDER — NOVOLOG PENFILL 100 UNIT/ML ~~LOC~~ SOCT
SUBCUTANEOUS | 11 refills | Status: DC
Start: 2023-07-23 — End: 2024-04-09

## 2023-07-23 MED ORDER — DULOXETINE HCL 30 MG PO CPEP
30.0000 mg | ORAL_CAPSULE | Freq: Every evening | ORAL | Status: DC
Start: 2023-07-23 — End: 2024-04-09

## 2023-07-23 MED ORDER — LANTUS SOLOSTAR 100 UNIT/ML ~~LOC~~ SOPN
16.0000 [IU] | PEN_INJECTOR | Freq: Every day | SUBCUTANEOUS | 0 refills | Status: DC
Start: 2023-07-23 — End: 2024-04-09

## 2023-07-23 MED ORDER — TIRZEPATIDE 7.5 MG/0.5ML ~~LOC~~ SOAJ: 7.5000 mg | SUBCUTANEOUS | 0 refills | Status: DC

## 2023-07-23 MED ORDER — DULOXETINE HCL 60 MG PO CPEP: 60.0000 mg | ORAL_CAPSULE | Freq: Every morning | ORAL | 3 refills | Status: DC

## 2023-07-23 MED ORDER — BELSOMRA 10 MG PO TABS: 10.0000 mg | ORAL_TABLET | Freq: Every evening | ORAL | 0 refills | Status: DC | PRN

## 2023-07-23 NOTE — Telephone Encounter (Signed)
Pt is calling to report that the pharmacy is needing the maxiumin dosage for the the sliding scale for insulin aspart (NOVOLOG PENFILL) cartridge [[id number]]   Suvorexant (BELSOMRA) 10 MG TABS [[id number]]  needs a PA.  Pt reports that her insurance will be ending this month. CB- [phone]

## 2023-07-23 NOTE — Progress Notes (Signed)
Established patient visit   Patient: Wendy Colon   DOB: 02-Aug-1976   47 y.o. Female  MRN: [id number] Visit Date: 07/23/2023  Today's healthcare provider: Mila Merry, MD   Chief Complaint  Patient presents with   Medical Management of Chronic Issues    Elevated blood sugar, has been elevated for awhile, would like to discuss anxiety and being restlessness, maybe stress related, stress has increased since last visit    Subjective    Discussed the use of AI scribe software for clinical note transcription with the patient, who gave verbal consent to proceed.  History of Present Illness   The patient of Dr. Rexanne Mano, with a 20-year history of diabetes, presents with poorly controlled blood sugars despite current therapy with Mounjaro 5mg , Amaryl, and Lantus insulin 10 units, which was started about a month ago. She reports blood sugars ranging from 257 to 341 mg/dL, particularly in the morning. She has tried lifestyle modifications such as drinking water and walking, but these have not helped to control her sugars. She has a history of using short-acting insulin and insulin R and NPH in the past, but recently she has only been on long-acting Lantus insulin.  In addition to her diabetes, the patient also suffers from depression and anxiety. She is currently on Cymbalta 60mg  for depression, which she reports has helped her depressive symptoms. However, she continues to struggle with anxiety, particularly in the mornings, and has difficulty sleeping. She has tried Buspar and Lexapro in the past for anxiety, but these were not effective.       Medications: Outpatient Medications Prior to Visit  Medication Sig   Continuous Glucose Sensor (FREESTYLE LIBRE 3 SENSOR) MISC 1 each by Does not apply route every 14 (fourteen) days. Place 1 sensor on the skin every 14 days. Use to check glucose continuously   ferrous sulfate 324 (65 Fe) MG TBEC Take 1 tablet by mouth 2 (two) times daily.    glimepiride (AMARYL) 2 MG tablet Take 1 tablet (2 mg total) by mouth 2 (two) times daily.   glucose blood test strip Use as instructed   linaclotide (LINZESS) 72 MCG capsule Take 1 capsule (72 mcg total) by mouth daily before breakfast.   losartan (COZAAR) 25 MG tablet Take 1 tablet (25 mg total) by mouth daily.   norethindrone (MICRONOR) 0.35 MG tablet Take 1 tablet by mouth daily.   rosuvastatin (CRESTOR) 10 MG tablet Take 1 tablet (10 mg total) by mouth daily.   tirzepatide [facility]) 5 MG/0.5ML Pen Inject 5 mg into the skin once a week.   [DISCONTINUED] DULoxetine (CYMBALTA) 30 MG capsule Take 1 capsule (30 mg total) by mouth 2 (two) times daily.   [DISCONTINUED] insulin glargine (LANTUS SOLOSTAR) 100 UNIT/ML Solostar Pen Inject 10 Units into the skin daily.   No facility-administered medications prior to visit.   Review of Systems     Objective    BP 125/81 (BP Location: Left Arm, Patient Position: Sitting, Cuff Size: Large)   Pulse 99   Ht 5\' 4"  (1.626 m)   Wt 216 lb (98 kg)   SpO2 98%   BMI 37.08 kg/m   Physical Exam  General appearance: Obese female, cooperative and in no acute distress Head: Normocephalic, without obvious abnormality, atraumatic Respiratory: Respirations even and unlabored, normal respiratory rate Extremities: All extremities are intact.  Skin: Skin color, texture, turgor normal. No rashes seen  Psych: Appropriate mood and affect. Neurologic: Mental status: Alert, oriented to person, place,  and time, thought content appropriate.     Results for orders placed or performed in visit on 07/23/23  POCT glucose (manual entry)  Result Value Ref Range   POC Glucose 154 (A) 70 - 99 mg/dl  POCT HgB G2X  Result Value Ref Range   Hemoglobin A1C 9.9 (A) 4.0 - 5.6 %   HbA1c POC (<> result, manual entry)     HbA1c, POC (prediabetic range)     HbA1c, POC (controlled diabetic range)      Assessment & Plan        Type 2 Diabetes Mellitus Uncontrolled with  fasting sugars in the 200s-300s. Currently on Lantus 10 units, Mounjaro 5mg , and Amaryl. History of using short-acting insulin. -Increase Lantus to 16 units daily. -Add NovoLog with a sliding scale for high sugars. -Increasing Mounjaro to 7.5mg  if insurance allows.  Depression Controlled on Cymbalta 60mg  daily. -Increase Cymbalta to 90mg  daily (60mg  in the morning, 30mg  at night) to help with anxiety.  Anxiety Uncontrolled, causing sleep disturbances. -Increase Cymbalta as above.   Insomnia - Belsomra 5mg  hs for sleep if needed.  Follow-up in 1 month with Dr. Payton Mccallum to assess medication changes.    Return for Dr. Payton Mccallum in 1 month, diabetes and anxiety.      Mila Merry, MD  [facility] [phone] (phone) [phone] (fax)  [facility]

## 2023-07-23 NOTE — Patient Instructions (Addendum)
Please review the attached list of medications and notify my office if there are any errors.     Check blood sugar before each meal      If blood sugar is >=200, but <250, take 2 units Novolog insulin     If blood sugar is >=250, but <300, take 4 units Novolog insulin     If blood sugar is >=300, but <350, take 6 units Novolog insulin     If blood sugar is >=350, but <400  take 8 units Novolog insulin     If blood sugar is >=400  take 10 units Novolog insulin  Please record all blood sugars and bring record to your next appointment.

## 2023-07-24 NOTE — Telephone Encounter (Signed)
Pt called in and spoke with Helmut Muster, [facility] Agent regarding pharmacy needing sliding scale directions for Novolog Pen dosage. Reviewed chart and sliding scale dosage instructions noted from OV 07/23/23 with Dr. Sherrie Mustache. Grier Rocher, [facility] Agent to verify [facility] [phone] was correct pharmacy and that I would call and give them a call. [facility] [phone] called and spoke to Trenton Gammon. Advised of sliding scale directions as below and verified that 30 units would be max/day. No further assistance was needed to approve Novolog Pen.   Check blood sugar before each meal       If blood sugar is >=200, but <250, take 2 units Novolog insulin     If blood sugar is >=250, but <300, take 4 units Novolog insulin     If blood sugar is >=300, but <350, take 6 units Novolog insulin     If blood sugar is >=350, but <400  take 8 units Novolog insulin     If blood sugar is >=400  take 10 units Novolog insulin

## 2023-07-24 NOTE — Telephone Encounter (Addendum)
Patient was calling to check the status update of the Prior Auth for medication uvorexant (BELSOMRA) 10 MG TABS, she stated this needs a prior auth and would like an update on this. Please document the status update of PA and update patient.   Patient also stated the pharmacy needs instructions for dosage for medication insulin aspart (NOVOLOG PENFILL) cartridge [[id number]] .She states the pharmacy received prescription with no instructions and insurance will not pay until this is handled. Patient states she has called several times and would like a call back today, 07/24/2023 regarding this.Patient states she can not keep going without her insulin.  Spoke with our NT Dorathy Daft and Dorathy Daft will call pharmacy for patient for this clarification of the NOVOLOG *  Patients callback # [phone]

## 2023-07-24 NOTE — Telephone Encounter (Signed)
Pt is calling back requesting an update. Stated pharmacy is needing the maxiumin dosage for the sliding scale for insulin aspart (NOVOLOG PENFILL) cartridge  Suvorexant (BELSOMRA) 10 MG TABS needs a PA.  Stated her insurance will be ending this month.   Please advise.

## 2023-07-25 NOTE — Telephone Encounter (Signed)
Patient called back in checking on the status of the prior authorization. Her insurance will expire by the end of the month and she is going out of town in the middle of the night Thursday/Friday and needs this done by the end of tomorrow so she won't have to worry that it is done before the insurance expires and so she can take it with her. Please assist patient further

## 2023-07-25 NOTE — Telephone Encounter (Signed)
PA initiated  Key: [id number]

## 2023-07-26 NOTE — Telephone Encounter (Signed)
Outcome Denied on September 25 by [facility] NCPDP 2017 Your PA request has been denied. Additional information will be provided in the denial communication. (Message 1140) Drug Belsomra 10MG  tablets

## 2023-07-30 ENCOUNTER — Telehealth: Payer: Self-pay | Admitting: Family Medicine

## 2023-10-29 ENCOUNTER — Ambulatory Visit
Admission: EM | Admit: 2023-10-29 | Discharge: 2023-10-29 | Disposition: A | Discharge status: Home / Self Care | Payer: Self-pay | Attending: Emergency Medicine | Admitting: Emergency Medicine

## 2023-10-29 ENCOUNTER — Encounter: Payer: Self-pay | Admitting: *Deleted

## 2023-10-29 ENCOUNTER — Other Ambulatory Visit: Payer: Self-pay

## 2023-10-29 DIAGNOSIS — S00511A Abrasion of lip, initial encounter: Secondary | ICD-10-CM

## 2023-10-29 DIAGNOSIS — J209 Acute bronchitis, unspecified: Secondary | ICD-10-CM

## 2023-10-29 DIAGNOSIS — R55 Syncope and collapse: Secondary | ICD-10-CM

## 2023-10-29 MED ORDER — DOXYCYCLINE HYCLATE 100 MG PO CAPS: 100.0000 mg | ORAL_CAPSULE | Freq: Two times a day (BID) | ORAL | 0 refills | Status: DC

## 2023-10-29 MED ORDER — PREDNISONE 10 MG (21) PO TBPK: ORAL_TABLET | Freq: Every day | ORAL | 0 refills | Status: DC

## 2023-10-29 MED ORDER — PROMETHAZINE-DM 6.25-15 MG/5ML PO SYRP: 5.0000 mL | ORAL_SOLUTION | Freq: Every evening | ORAL | 0 refills | Status: DC | PRN

## 2023-10-29 MED ORDER — DEXAMETHASONE SODIUM PHOSPHATE 10 MG/ML IJ SOLN
10.0000 mg | Freq: Once | INTRAMUSCULAR | Status: AC
  Administered 2023-10-29: 10 mg via INTRAMUSCULAR

## 2023-10-29 MED ORDER — BENZONATATE 100 MG PO CAPS: 100.0000 mg | ORAL_CAPSULE | Freq: Three times a day (TID) | ORAL | 0 refills | Status: DC

## 2023-10-29 MED ORDER — ALBUTEROL SULFATE (2.5 MG/3ML) 0.083% IN NEBU
2.5000 mg | INHALATION_SOLUTION | Freq: Once | RESPIRATORY_TRACT | Status: AC
  Administered 2023-10-29: 2.5 mg via RESPIRATORY_TRACT

## 2023-10-29 NOTE — Discharge Instructions (Signed)
On exam there is yellow puslike drainage to the lip and therefore you will be started on antibiotic to cover for infection  Begin doxycycline every morning and every evening for 7 days which also provides coverage for the airway  On exam there is wheezing throughout the lungs which is the sound of airway tightness you have been given a nebulizer treatment and a injection of steroids, on reevaluation the lungs do appear to be calmed  Use Tessalon pill every 8 hours to help with coughing, may use cough syrup at bedtime for additional comfort    You can take Tylenol and/or Ibuprofen as needed for fever reduction and pain relief.   For cough: honey 1/2 to 1 teaspoon (you can dilute the honey in water or another fluid).  You can also use guaifenesin and dextromethorphan for cough. You can use a humidifier for chest congestion and cough.  If you don't have a humidifier, you can sit in the bathroom with the hot shower running.      For sore throat: try warm salt water gargles, cepacol lozenges, throat spray, warm tea or water with lemon/honey, popsicles or ice, or OTC cold relief medicine for throat discomfort.   For congestion: take a daily anti-histamine like Zyrtec, Claritin, and a oral decongestant, such as pseudoephedrine.  You can also use Flonase 1-2 sprays in each nostril daily.   It is important to stay hydrated: drink plenty of fluids (water, gatorade/powerade/pedialyte, juices, or teas) to keep your throat moisturized and help further relieve irritation/discomfort.

## 2023-10-29 NOTE — ED Triage Notes (Signed)
PT reports syncope at home and woke up on the floor. Pt has an injury to lower lip. Pt also reports having a cough and fever .

## 2023-10-29 NOTE — ED Provider Notes (Signed)
Wendy Colon    CSN: [id number] Arrival date & time: 10/29/23  1238      History   Chief Complaint Chief Complaint  Patient presents with   Cough   Fever   Loss of Consciousness    HPI Wendy Colon is a 47 y.o. female.   Patient presents for evaluation of nasal congestion, rhinorrhea productive cough, shortness of breath with exertion and wheezing present for 4 days.  Associated fever peaking at 100.7.  Endorses a syncopal episode that occurred 3 days ago, was sitting when she began coughing, unsure of what happened next if she awoke on the floor with busted lip, event unwitnessed.  Has abrasion to the left lower lip, concern with infection as she is unsure if pus is present.  Past Medical History:  Diagnosis Date   Anemia    Anxiety 2018   Depression    Diabetes mellitus without complication (HCC)    GERD (gastroesophageal reflux disease) 2004    Patient Active Problem List   Diagnosis Date Noted   Generalized anxiety disorder 06/25/2023   History of Helicobacter pylori infection 04/25/2023   Depression 02/16/2023   Gastroesophageal reflux disease without esophagitis 08/31/2022   Constipation 08/31/2022   Acute cystitis without hematuria 08/31/2022   Hypertension 03/30/2022   Hyperglycemia due to type 2 diabetes mellitus (HCC) 03/30/2022   Class 2 severe obesity with serious comorbidity and body mass index (BMI) of 37.0 to 37.9 in adult (HCC) 03/30/2022   Renal dysfunction 03/30/2022   History of iron deficiency anemia 03/30/2022   Diabetes 1.5, 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      Home Medications    Prior to Admission medications   Medication Sig Start  Date End Date Taking? Authorizing Provider  benzonatate (TESSALON) 100 MG capsule Take 1 capsule (100 mg total) by mouth every 8 (eight) hours. 10/29/23  Yes Anis Cinelli, Elita Boone, NP  doxycycline (VIBRAMYCIN) 100 MG capsule Take 1 capsule (100 mg total) by mouth 2 (two) times daily. 10/29/23  Yes Amma Crear R, NP  predniSONE (STERAPRED UNI-PAK 21 TAB) 10 MG (21) TBPK tablet Take by mouth daily. Take 6 tabs by mouth daily  for 1 days, then 5 tabs for 1 days, then 4 tabs for 1 days, then 3 tabs for 1 days, 2 tabs for 1 days, then 1 tab by mouth daily for 1 days 10/29/23  Yes Ryota Treece R, NP  promethazine-dextromethorphan (PROMETHAZINE-DM) 6.25-15 MG/5ML syrup Take 5 mLs by mouth at bedtime as needed for cough. 10/29/23  Yes Bristal Steffy R, NP  Continuous Glucose Sensor (FREESTYLE LIBRE 3 SENSOR) MISC 1 each by Does not apply route every 14 (fourteen) days. Place 1 sensor on the skin every 14 days. Use to check glucose continuously 04/25/23   Sherlyn Hay, DO  DULoxetine (CYMBALTA) 30 MG capsule Take 1 capsule (30 mg total) by mouth every evening. 07/23/23   Malva Limes, MD  DULoxetine (CYMBALTA) 60 MG capsule Take 1 capsule (60 mg total) by mouth in the morning. 07/23/23   Malva Limes, MD  ferrous sulfate 324 (65 Fe) MG TBEC Take 1 tablet by mouth  2 (two) times daily.    [provider]  glimepiride (AMARYL) 2 MG tablet Take 1 tablet (2 mg total) by mouth 2 (two) times daily. 06/25/23   Sherlyn Hay, DO  glucose blood test strip Use as instructed 06/25/23   Sherlyn Hay, DO  insulin aspart (NOVOLOG PENFILL) cartridge Take on sliding scale before meals as directed by physician 07/23/23   Malva Limes, MD  insulin glargine (LANTUS SOLOSTAR) 100 UNIT/ML Solostar Pen Inject 16 Units into the skin daily. 07/23/23   Malva Limes, MD  linaclotide Karlene Einstein) 72 MCG capsule Take 1 capsule (72 mcg total) by mouth daily before breakfast. 06/25/23   Pardue, Monico Blitz, DO  losartan  (COZAAR) 25 MG tablet Take 1 tablet (25 mg total) by mouth daily. 06/25/23   Sherlyn Hay, DO  norethindrone (MICRONOR) 0.35 MG tablet Take 1 tablet by mouth daily. 04/28/22   [provider]  rosuvastatin (CRESTOR) 10 MG tablet Take 1 tablet (10 mg total) by mouth daily. 04/25/23   Pardue, Monico Blitz, DO  Suvorexant (BELSOMRA) 10 MG TABS Take 1 tablet (10 mg total) by mouth at bedtime as needed. 07/23/23   Malva Limes, MD  tirzepatide [facility]) 5 MG/0.5ML Pen Inject 5 mg into the skin once a week. 06/25/23   Sherlyn Hay, DO  tirzepatide [facility]) 7.5 MG/0.5ML Pen Inject 7.5 mg into the skin once a week. 07/23/23   Malva Limes, MD    Family History Family History  Problem Relation Age of Onset   Diabetes Mother    Hypertension Mother    Heart disease Mother    Kidney disease Father    Diabetes Father    Hypertension Father    Heart disease Father    Diabetes Sister    Diabetes Sister    Diabetes Sister    Stomach cancer Neg Hx    Colon cancer Neg Hx    Colon polyps Neg Hx     Social History Social History   Tobacco Use   Smoking status: Former    Current packs/day: 0.00    Types: Cigarettes    Quit date: 10/30/2022    Years since quitting: 0.9   Smokeless tobacco: Never  Vaping Use   Vaping status: Never Used  Substance Use Topics   Alcohol use: Not Currently    Comment: ocassional   Drug use: Never     Allergies   Metformin hcl   Review of Systems Review of Systems  Constitutional:  Positive for fever.  Respiratory:  Positive for cough.      Physical Exam Triage Vital Signs ED Triage Vitals  Encounter Vitals Group     BP 10/29/23 1355 136/85     Systolic BP Percentile --      Diastolic BP Percentile --      Pulse Rate 10/29/23 1355 84     Resp 10/29/23 1355 18     Temp 10/29/23 1355 99.3 F (37.4 C)     Temp Source 10/29/23 1355 Oral     SpO2 10/29/23 1355 98 %     Weight --      Height --      Head Circumference --      Peak  Flow --      Pain Score 10/29/23 1434 0     Pain Loc --      Pain Education --      Exclude from Growth Chart --    No  data found.  Updated Vital Signs BP 136/85 (BP Location: Right Arm)   Pulse 84   Temp 99.3 F (37.4 C) (Oral)   Resp 18   LMP 10/26/2023 (Approximate)   SpO2 98%   Visual Acuity Right Eye Distance:   Left Eye Distance:   Bilateral Distance:    Right Eye Near:   Left Eye Near:    Bilateral Near:     Physical Exam Constitutional:      Appearance: Normal appearance.  HENT:     Head: Normocephalic.     Right Ear: Tympanic membrane, ear canal and external ear normal.     Left Ear: Tympanic membrane, ear canal and external ear normal.     Nose: Congestion present. No rhinorrhea.     Mouth/Throat:     Mouth: Mucous membranes are moist.     Pharynx: Oropharynx is clear.  Eyes:     Extraocular Movements: Extraocular movements intact.  Cardiovascular:     Rate and Rhythm: Normal rate and regular rhythm.     Pulses: Normal pulses.     Heart sounds: Normal heart sounds.  Pulmonary:     Effort: Pulmonary effort is normal.     Breath sounds: Wheezing present.  Skin:    Comments: 1 x 2 cm abrasion present to the left side of the lower lip with yellow drainage noted  Neurological:     Mental Status: She is alert and oriented to person, place, and time. Mental status is at baseline.      UC Treatments / Results  Labs (all labs ordered are listed, but only abnormal results are displayed) Labs Reviewed - No data to display  EKG   Radiology No results found.  Procedures Procedures (including critical care time)  Medications Ordered in UC Medications  dexamethasone (DECADRON) injection 10 mg (10 mg Intramuscular Given 10/29/23 1422)  albuterol (PROVENTIL) (2.5 MG/3ML) 0.083% nebulizer solution 2.5 mg (2.5 mg Nebulization Given 10/29/23 1410)    Initial Impression / Assessment and Plan / UC Course  I have reviewed the triage vital signs and the  nursing notes.  Pertinent labs & imaging results that were available during my care of the patient were reviewed by me and considered in my medical decision making (see chart for details).  Acute bronchitis, abrasion of lip, syncope  Vital signs are stable, O2 saturation 98% on room air, coarse wheezing heard throughout the lobes, no neurological deficits on exam, endorses syncopal episode, most consistent with a vasovagal, EKG shows normal sinus rhythm, stable for outpatient management, albuterol nebulizer given and on reevaluation wheezing has come to the lower lobes, still present to the bilateral upper, discussed this with patient, Decadron IM given as well, prescribed prednisone, Tessalon and Promethazine DM for management, offered albuterol inhaler but endorses she has availability of 1 at home recommend over-the-counter supportive measures and advised follow-up if symptoms continue to persist or worsen  Abrasion to the lip, has begun to scab but able to notate yellow pus drainage therefore placed on antibiotics, prescribed doxycycline will provide coverage for the airway as well, can cleanse daily with soap and water and continue to monitor with follow-up if symptoms persist or worsen Final Clinical Impressions(s) / UC Diagnoses   Final diagnoses:  Syncope, unspecified syncope type  Acute bronchitis, unspecified organism  Abrasion of lip, initial encounter     Discharge Instructions      On exam there is yellow puslike drainage to the lip and therefore you will be started  on antibiotic to cover for infection  Begin doxycycline every morning and every evening for 7 days which also provides coverage for the airway  On exam there is wheezing throughout the lungs which is the sound of airway tightness you have been given a nebulizer treatment and a injection of steroids, on reevaluation the lungs do appear to be calmed  Use Tessalon pill every 8 hours to help with coughing, may use  cough syrup at bedtime for additional comfort    You can take Tylenol and/or Ibuprofen as needed for fever reduction and pain relief.   For cough: honey 1/2 to 1 teaspoon (you can dilute the honey in water or another fluid).  You can also use guaifenesin and dextromethorphan for cough. You can use a humidifier for chest congestion and cough.  If you don't have a humidifier, you can sit in the bathroom with the hot shower running.      For sore throat: try warm salt water gargles, cepacol lozenges, throat spray, warm tea or water with lemon/honey, popsicles or ice, or OTC cold relief medicine for throat discomfort.   For congestion: take a daily anti-histamine like Zyrtec, Claritin, and a oral decongestant, such as pseudoephedrine.  You can also use Flonase 1-2 sprays in each nostril daily.   It is important to stay hydrated: drink plenty of fluids (water, gatorade/powerade/pedialyte, juices, or teas) to keep your throat moisturized and help further relieve irritation/discomfort.    ED Prescriptions     Medication Sig Dispense Auth. Provider   predniSONE (STERAPRED UNI-PAK 21 TAB) 10 MG (21) TBPK tablet Take by mouth daily. Take 6 tabs by mouth daily  for 1 days, then 5 tabs for 1 days, then 4 tabs for 1 days, then 3 tabs for 1 days, 2 tabs for 1 days, then 1 tab by mouth daily for 1 days 21 tablet Kess Mcilwain R, NP   benzonatate (TESSALON) 100 MG capsule Take 1 capsule (100 mg total) by mouth every 8 (eight) hours. 21 capsule Juwuan Sedita R, NP   promethazine-dextromethorphan (PROMETHAZINE-DM) 6.25-15 MG/5ML syrup Take 5 mLs by mouth at bedtime as needed for cough. 118 mL Naarah Borgerding R, NP   doxycycline (VIBRAMYCIN) 100 MG capsule Take 1 capsule (100 mg total) by mouth 2 (two) times daily. 14 capsule Kaeleigh Westendorf, Elita Boone, NP      PDMP not reviewed this encounter.   Valinda Hoar, NP 10/29/23 [phone]

## 2023-12-03 ENCOUNTER — Other Ambulatory Visit: Payer: Self-pay | Admitting: Obstetrics and Gynecology

## 2023-12-03 DIAGNOSIS — N6310 Unspecified lump in the right breast, unspecified quadrant: Secondary | ICD-10-CM

## 2024-02-29 ENCOUNTER — Telehealth: Payer: Self-pay

## 2024-02-29 DIAGNOSIS — E1165 Type 2 diabetes mellitus with hyperglycemia: Secondary | ICD-10-CM

## 2024-02-29 DIAGNOSIS — E1159 Type 2 diabetes mellitus with other circulatory complications: Secondary | ICD-10-CM

## 2024-02-29 DIAGNOSIS — Z114 Encounter for screening for human immunodeficiency virus [HIV]: Secondary | ICD-10-CM

## 2024-02-29 DIAGNOSIS — Z862 Personal history of diseases of the blood and blood-forming organs and certain disorders involving the immune mechanism: Secondary | ICD-10-CM

## 2024-02-29 NOTE — Telephone Encounter (Signed)
 Copied from CRM [id number]. Topic: Clinical - Request for Lab/Test Order >> Feb 28, 2024  3:43 PM Lizabeth Riggs wrote: Reason for CRM:  Verlin would like to redo her lab for her kidney function that she took about 1 year ago. She said she received a letter stating there were an error in the testing. Please call or send her a message through MyChart. Thanks

## 2024-03-04 ENCOUNTER — Encounter: Payer: Self-pay | Admitting: Family Medicine

## 2024-04-01 ENCOUNTER — Other Ambulatory Visit: Payer: Self-pay | Admitting: Family Medicine

## 2024-04-01 ENCOUNTER — Telehealth: Payer: Self-pay | Admitting: Family Medicine

## 2024-04-01 DIAGNOSIS — E1165 Type 2 diabetes mellitus with hyperglycemia: Secondary | ICD-10-CM

## 2024-04-01 NOTE — Telephone Encounter (Signed)
 [facility] pharmacy is requesting refill ACCU-CHEK GUIDE TEST STRIPS 50 Do no see on current med list Please advise

## 2024-04-03 MED ORDER — GLUCOSE BLOOD VI STRP
ORAL_STRIP | 12 refills | Status: DC
Start: 2024-04-03 — End: 2024-04-04

## 2024-04-04 ENCOUNTER — Telehealth: Payer: Self-pay | Admitting: Family Medicine

## 2024-04-04 DIAGNOSIS — E139 Other specified diabetes mellitus without complications: Secondary | ICD-10-CM

## 2024-04-04 DIAGNOSIS — E1165 Type 2 diabetes mellitus with hyperglycemia: Secondary | ICD-10-CM

## 2024-04-04 MED ORDER — GLUCOSE BLOOD VI STRP: ORAL_STRIP | 12 refills | Status: AC

## 2024-04-04 NOTE — Telephone Encounter (Signed)
 [facility] pharmacy is requesting refill NOVOLOG  F:EXPEN INJ (ORANGE) Please advise

## 2024-04-04 NOTE — Telephone Encounter (Signed)
 [facility] pharmacy is requesting refill ONE TOUCH VERIO TEST STR(NEW) 50 Please advise

## 2024-04-08 MED ORDER — INSULIN ASPART 100 UNIT/ML FLEXPEN
PEN_INJECTOR | SUBCUTANEOUS | 11 refills | Status: DC
Start: 2024-04-08 — End: 2024-04-09

## 2024-04-08 NOTE — Telephone Encounter (Signed)
 [facility] is needing the maximum number of units per day patient can use of Novolog .  Please rewrite RX and resent to pharmacy.

## 2024-04-08 NOTE — Telephone Encounter (Signed)
Please see pharmacy message below

## 2024-04-09 ENCOUNTER — Ambulatory Visit: Payer: Self-pay | Admitting: Family Medicine

## 2024-04-09 VITALS — BP 111/75 | HR 89 | Resp 16 | Ht 64.0 in | Wt 217.6 lb

## 2024-04-09 DIAGNOSIS — K219 Gastro-esophageal reflux disease without esophagitis: Secondary | ICD-10-CM | POA: Diagnosis not present

## 2024-04-09 DIAGNOSIS — E1165 Type 2 diabetes mellitus with hyperglycemia: Secondary | ICD-10-CM

## 2024-04-09 DIAGNOSIS — K5900 Constipation, unspecified: Secondary | ICD-10-CM

## 2024-04-09 DIAGNOSIS — F339 Major depressive disorder, recurrent, unspecified: Secondary | ICD-10-CM

## 2024-04-09 DIAGNOSIS — F411 Generalized anxiety disorder: Secondary | ICD-10-CM

## 2024-04-09 DIAGNOSIS — G4709 Other insomnia: Secondary | ICD-10-CM

## 2024-04-09 DIAGNOSIS — I10 Essential (primary) hypertension: Secondary | ICD-10-CM | POA: Diagnosis not present

## 2024-04-09 DIAGNOSIS — E139 Other specified diabetes mellitus without complications: Secondary | ICD-10-CM

## 2024-04-09 DIAGNOSIS — Z6837 Body mass index (BMI) 37.0-37.9, adult: Secondary | ICD-10-CM

## 2024-04-09 DIAGNOSIS — Z794 Long term (current) use of insulin: Secondary | ICD-10-CM

## 2024-04-09 MED ORDER — ESZOPICLONE 1 MG PO TABS: 1.0000 mg | ORAL_TABLET | Freq: Every evening | ORAL | 2 refills | Status: DC | PRN

## 2024-04-09 MED ORDER — FLUOXETINE HCL 10 MG PO CAPS: 10.0000 mg | ORAL_CAPSULE | Freq: Every day | ORAL | 0 refills | Status: DC

## 2024-04-09 MED ORDER — PANTOPRAZOLE SODIUM 40 MG PO TBEC: 40.0000 mg | DELAYED_RELEASE_TABLET | Freq: Every day | ORAL | 5 refills | Status: DC

## 2024-04-09 MED ORDER — TIRZEPATIDE 2.5 MG/0.5ML ~~LOC~~ SOAJ: 2.5000 mg | SUBCUTANEOUS | Status: AC

## 2024-04-09 MED ORDER — LINACLOTIDE 72 MCG PO CAPS
72.0000 ug | ORAL_CAPSULE | Freq: Every day | ORAL | 3 refills | Status: AC | PRN
Start: 2024-04-09 — End: ?

## 2024-04-09 MED ORDER — INSULIN ASPART 100 UNIT/ML FLEXPEN
PEN_INJECTOR | SUBCUTANEOUS | 11 refills | Status: AC
Start: 2024-04-09 — End: ?

## 2024-04-09 MED ORDER — LOSARTAN POTASSIUM 25 MG PO TABS: 25.0000 mg | ORAL_TABLET | Freq: Every day | ORAL | 5 refills | Status: DC

## 2024-04-09 MED ORDER — INSULIN ASPART 100 UNIT/ML FLEXPEN
PEN_INJECTOR | SUBCUTANEOUS | 11 refills | Status: DC
Start: 2024-04-09 — End: 2024-04-09

## 2024-04-09 MED ORDER — GLIMEPIRIDE 2 MG PO TABS: 2.0000 mg | ORAL_TABLET | Freq: Two times a day (BID) | ORAL | 5 refills | Status: DC

## 2024-04-09 MED ORDER — ROSUVASTATIN CALCIUM 5 MG PO TABS: 5.0000 mg | ORAL_TABLET | Freq: Every day | ORAL | 5 refills | Status: AC

## 2024-04-09 MED ORDER — LANTUS SOLOSTAR 100 UNIT/ML ~~LOC~~ SOPN
16.0000 [IU] | PEN_INJECTOR | Freq: Every day | SUBCUTANEOUS | 0 refills | Status: AC
Start: 2024-04-09 — End: ?

## 2024-04-09 MED ORDER — FLUOXETINE HCL 20 MG PO CAPS: 20.0000 mg | ORAL_CAPSULE | Freq: Every day | ORAL | 2 refills | Status: AC

## 2024-04-09 NOTE — Progress Notes (Signed)
 Established patient visit   Patient: Wendy Colon   DOB: 1976-06-14   48 y.o. Female  MRN: [id number] Visit Date: 04/09/2024  Today's healthcare provider: Carlean Charter, DO   Chief Complaint  Patient presents with   Diabetes   Weight Loss   Insomnia   Subjective    HPI Wendy Colon is a 48 year old female with diabetes who presents for follow-up on diabetes management, weight loss, and sleep issues.  She has been experiencing fluctuating blood sugar levels, with recent morning readings of 93, 163, and 87 mg/dL. She is actively managing her diabetes through dietary changes, including reducing carbohydrate and junk food intake, which she believes has helped stabilize her blood sugar. She is not currently taking Mounjaro  due to insurance issues and has stopped taking Cymbalta  due to adverse effects. She continues to take losartan  25 mg, glimepiride , and Protonix  regularly. She has not been taking rosuvastatin  due to personal preference (never started) and is interested in monitoring her cholesterol levels without medication. She has recently filled her Lantus  prescription.  She experiences significant anxiety and depression, describing her anxiety as 'through the roof'. She has tried several medications in the past, including Cymbalta , Wellbutrin , Buspar , and Lexapro , but experienced side effects such as feeling 'way out there' and fatigue. She has not been on any anxiety or depression medication recently.  She is experiencing trouble sleeping, often waking up in the middle of the night and unable to return to sleep. She has tried various sleep aids, including trazodone and doxepin , but found them ineffective or experienced undesirable side effects. No snoring, waking up gasping for air, or morning headaches.  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 She is trying to increase her physical activity and  is aiming to walk more each day. No numbness or tingling in her feet and she checks her feet regularly. She describes a constant state of 'racing' throughout the day.      Medications: Outpatient Medications Prior to Visit  Medication Sig   Continuous Glucose Sensor (FREESTYLE LIBRE 3 SENSOR) MISC 1 each by Does not apply route every 14 (fourteen) days. Place 1 sensor on the skin every 14 days. Use to check glucose continuously   ferrous sulfate 324 (65 Fe) MG TBEC Take 1 tablet by mouth 2 (two) times daily.   glucose blood test strip Use as instructed   norethindrone (MICRONOR) 0.35 MG tablet Take 1 tablet by mouth daily.   [DISCONTINUED] glimepiride  (AMARYL ) 2 MG tablet TAKE 1 TABLET(2 MG) BY MOUTH TWICE DAILY   [DISCONTINUED] insulin aspart  (NOVOLOG ) 100 UNIT/ML FlexPen Take on sliding scale before meals as directed by physician. Maximum 10 units per day   [DISCONTINUED] insulin glargine  (LANTUS  SOLOSTAR) 100 UNIT/ML Solostar Pen Inject 16 Units into the skin daily.   [DISCONTINUED] linaclotide  (LINZESS ) 72 MCG capsule Take 1 capsule (72 mcg total) by mouth daily before breakfast.   [DISCONTINUED] losartan  (COZAAR ) 25 MG tablet Take 1 tablet (25 mg total) by mouth daily.   [DISCONTINUED] Suvorexant  (BELSOMRA ) 10 MG TABS Take 1 tablet (10 mg total) by mouth at bedtime as needed.   tirzepatide  (MOUNJARO ) 5 MG/0.5ML Pen Inject 5 mg into the skin once a week. (Patient not taking: Reported on 04/09/2024)   [DISCONTINUED] benzonatate  (TESSALON ) 100 MG capsule Take 1 capsule (100 mg total) by mouth every 8 (eight) hours.   [DISCONTINUED] doxycycline  (  VIBRAMYCIN ) 100 MG capsule Take 1 capsule (100 mg total) by mouth 2 (two) times daily.   [DISCONTINUED] DULoxetine  (CYMBALTA ) 30 MG capsule Take 1 capsule (30 mg total) by mouth every evening.   [DISCONTINUED] DULoxetine  (CYMBALTA ) 60 MG capsule Take 1 capsule (60 mg total) by mouth in the morning.   [DISCONTINUED] predniSONE  (STERAPRED UNI-PAK 21  TAB) 10 MG (21) TBPK tablet Take by mouth daily. Take 6 tabs by mouth daily  for 1 days, then 5 tabs for 1 days, then 4 tabs for 1 days, then 3 tabs for 1 days, 2 tabs for 1 days, then 1 tab by mouth daily for 1 days   [DISCONTINUED] promethazine -dextromethorphan (PROMETHAZINE -DM) 6.25-15 MG/5ML syrup Take 5 mLs by mouth at bedtime as needed for cough.   [DISCONTINUED] rosuvastatin  (CRESTOR ) 10 MG tablet Take 1 tablet (10 mg total) by mouth daily.   [DISCONTINUED] tirzepatide  (MOUNJARO ) 7.5 MG/0.5ML Pen Inject 7.5 mg into the skin once a week.   No facility-administered medications prior to visit.        Objective    BP 111/75 (BP Location: Left Arm, Patient Position: Sitting, Cuff Size: Normal)   Pulse 89   Resp 16   Ht 5' 4 (1.626 m)   Wt 217 lb 9.6 oz (98.7 kg)   SpO2 97%   BMI 37.35 kg/m    The 10-year ASCVD risk score (Arnett DK, et al., 2019) is: 2.8%   Values used to calculate the score:     Age: 20 years     Sex: Female     Is Non-Hispanic African American: Yes     Diabetic: Yes     Tobacco smoker: No     Systolic Blood Pressure: 111 mmHg     Is BP treated: Yes     HDL Cholesterol: 64.8 mg/dL     Total Cholesterol: 170 mg/dL   Physical Exam Vitals and nursing note reviewed.  Constitutional:      General: She is awake.     Appearance: Normal appearance.  HENT:     Head: Normocephalic and atraumatic.     Right Ear: Tympanic membrane, ear canal and external ear normal.     Left Ear: Tympanic membrane, ear canal and external ear normal.     Nose: Nose normal.     Mouth/Throat:     Mouth: Mucous membranes are moist.     Pharynx: Oropharynx is clear. No oropharyngeal exudate or posterior oropharyngeal erythema.  Eyes:     General: No scleral icterus.    Extraocular Movements: Extraocular movements intact.     Conjunctiva/sclera: Conjunctivae normal.     Pupils: Pupils are equal, round, and reactive to light.  Neck:     Thyroid: No thyromegaly or thyroid  tenderness.  Cardiovascular:     Rate and Rhythm: Normal rate and regular rhythm.     Pulses: Normal pulses.     Heart sounds: Normal heart sounds.  Pulmonary:     Effort: Pulmonary effort is normal. No tachypnea, bradypnea or respiratory distress.     Breath sounds: Normal breath sounds. No stridor. No wheezing, rhonchi or rales.  Abdominal:     General: Bowel sounds are normal. There is no distension.     Palpations: Abdomen is soft. There is no mass.     Tenderness: There is no abdominal tenderness. There is no guarding.     Hernia: No hernia is present.  Musculoskeletal:     Cervical back: Normal range of motion and neck  supple.     Right lower leg: No edema.     Left lower leg: No edema.  Lymphadenopathy:     Cervical: No cervical adenopathy.  Skin:    General: Skin is warm and dry.  Neurological:     Mental Status: She is alert and oriented to person, place, and time. Mental status is at baseline.  Psychiatric:        Mood and Affect: Mood normal.        Behavior: Behavior normal.      No results found for any visits on 04/09/24.  Assessment & Plan    Gastroesophageal reflux disease without esophagitis -     Pantoprazole  Sodium; Take 1 tablet (40 mg total) by mouth daily.  Dispense: 30 tablet; Refill: 5  Diabetes 1.5, managed as type 2 (HCC) -     Insulin Aspart ; Take on sliding scale before meals as directed by physician. Maximum 10 units per day  Dispense: 15 mL; Refill: 11 -     Rosuvastatin  Calcium ; Take 1 tablet (5 mg total) by mouth daily.  Dispense: 30 tablet; Refill: 5  Constipation, unspecified constipation type -     linaCLOtide ; Take 1 capsule (72 mcg total) by mouth daily as needed (constipation).  Dispense: 30 capsule; Refill: 3  Hypertension, unspecified type -     Losartan  Potassium; Take 1 tablet (25 mg total) by mouth daily.  Dispense: 30 tablet; Refill: 5  Type 2 diabetes mellitus with hyperglycemia, with long-term current use of insulin (HCC) -      Lantus  SoloStar; Inject 16 Units into the skin daily.  Dispense: 15 mL; Refill: 0 -     Glimepiride ; Take 1 tablet (2 mg total) by mouth 2 (two) times daily with a meal.  Dispense: 60 tablet; Refill: 5 -     Tirzepatide ; Inject 2.5 mg into the skin once a week.  Generalized anxiety disorder  Episode of recurrent major depressive disorder, unspecified depression episode severity (HCC) -     FLUoxetine HCl; Take 1 capsule (20 mg total) by mouth daily.  Dispense: 30 capsule; Refill: 2 -     FLUoxetine HCl; Take 1 capsule (10 mg total) by mouth daily.  Dispense: 28 capsule; Refill: 0  Class 2 severe obesity due to excess calories with serious comorbidity and body mass index (BMI) of 37.0 to 37.9 in adult [facility])  Other insomnia -     Eszopiclone; Take 1 tablet (1 mg total) by mouth at bedtime as needed and may repeat dose one time if needed for sleep. Take immediately before bedtime  Dispense: 60 tablet; Refill: 2       Type 2 Diabetes Mellitus Variable blood glucose levels managed through diet and weight loss. Not on Mounjaro  due to cost/insurance lapse. Declined pneumonia vaccine. - Order blood work for diabetes control. - Perform foot exam. - Gave sample box of Mounjaro  2.5 mg weekly. - Send next dose of Mounjaro  when patient requests or continue Mounjaro  2.5 mg if not tolerating it well. - Consider nutritionist referral.  Hyperlipidemia LDL at 90 mg/dL. Statin therapy recommended for cardiovascular risk reduction due to diabetes. - Prescribe rosuvastatin  5 mg. - Discuss statin therapy importance.  Insomnia Chronic insomnia with anxiety and depression contributing. Lunesta preferred for sleep maintenance. - Prescribe fluoxetine 20 mg. - Consider cognitive behavioral therapy. - Send Lunesta prescription to [facility].  Anxiety and Depression Chronic anxiety and depression with poor tolerance to previous medications. Open to fluoxetine. - Prescribe fluoxetine 20 mg,  option to  start at 10 mg (opted for by patient). - Discuss cognitive behavioral therapy (CBT) benefits. Patient declined referral.  General Health Maintenance Recent eye exam and mammogram. Declined pneumonia vaccine and COVID booster. Received tetanus vaccine. - Re-printed orders for blood work. - Request eye exam. - Refilled chronic medications - printed prescriptions per patient preference. Patient to notify clinic of new preferred pharmacy. - Mammogram due in July; ordered by Dr. Leslee Rase. - Record tetanus vaccine. - Declined vaccines.    Return in about 6 weeks (around 05/21/2024) for Anx/Dep.      I discussed the assessment and treatment plan with the patient  The patient was provided an opportunity to ask questions and all were answered. The patient agreed with the plan and demonstrated an understanding of the instructions.   The patient was advised to call back or seek an in-person evaluation if the symptoms worsen or if the condition fails to improve as anticipated.  Total time was 50 minutes. That includes chart review before the visit, the actual patient visit, and time spent on documentation after the visit.     Carlean Charter, DO  [facility] [phone] (phone) [phone] (fax)  [facility]

## 2024-04-10 ENCOUNTER — Telehealth: Payer: Self-pay

## 2024-04-10 ENCOUNTER — Telehealth: Payer: Self-pay | Admitting: Family Medicine

## 2024-04-10 LAB — COMPREHENSIVE METABOLIC PANEL WITH GFR
ALT: 14 IU/L (ref 0–32)
AST: 15 IU/L (ref 0–40)
Albumin: 4.5 g/dL (ref 3.9–4.9)
Alkaline Phosphatase: 73 IU/L (ref 44–121)
BUN/Creatinine Ratio: 20 (ref 9–23)
BUN: 18 mg/dL (ref 6–24)
Bilirubin Total: <0.2 mg/dL (ref 0.0–1.2)
CO2: 20 mmol/L (ref 20–29)
Calcium: 9 mg/dL (ref 8.7–10.2)
Chloride: 103 mmol/L (ref 96–106)
Creatinine, Ser: 0.91 mg/dL (ref 0.57–1.00)
Globulin, Total: 2.5 g/dL (ref 1.5–4.5)
Glucose: 104 mg/dL — ABNORMAL HIGH (ref 70–99)
Potassium: 4.7 mmol/L (ref 3.5–5.2)
Sodium: 138 mmol/L (ref 134–144)
Total Protein: 7 g/dL (ref 6.0–8.5)
eGFR: 77 mL/min/{1.73_m2} (ref >59)

## 2024-04-10 LAB — IRON,TIBC AND FERRITIN PANEL
Ferritin: 26 ng/mL (ref 15–150)
Iron Saturation: 14 % — ABNORMAL LOW (ref 15–55)
Iron: 47 ug/dL (ref 27–159)
Total Iron Binding Capacity: 348 ug/dL (ref 250–450)
UIBC: 301 ug/dL (ref 131–425)

## 2024-04-10 LAB — LIPID PANEL
Chol/HDL Ratio: 2.8 ratio (ref 0.0–4.4)
Cholesterol, Total: 177 mg/dL (ref 100–199)
HDL: 63 mg/dL (ref >39)
LDL Chol Calc (NIH): 100 mg/dL — ABNORMAL HIGH (ref 0–99)
Triglycerides: 73 mg/dL (ref 0–149)
VLDL Cholesterol Cal: 14 mg/dL (ref 5–40)

## 2024-04-10 LAB — MICROALBUMIN / CREATININE URINE RATIO
Creatinine, Urine: 102 mg/dL
Microalb/Creat Ratio: 10 mg/g{creat} (ref 0–29)
Microalbumin, Urine: 10.5 ug/mL

## 2024-04-10 LAB — HIV ANTIBODY (ROUTINE TESTING W REFLEX): HIV Screen 4th Generation wRfx: NONREACTIVE

## 2024-04-10 LAB — HEMOGLOBIN A1C
Est. average glucose Bld gHb Est-mCnc: 206 mg/dL
Hgb A1c MFr Bld: 8.8 % — ABNORMAL HIGH (ref 4.8–5.6)

## 2024-04-10 NOTE — Telephone Encounter (Addendum)
 Called patient to ask in regards of urine? Was advised by front desk she stopped by office to drop off a urine, unaware where she dropped urine at, looks like pt did drop of urine and has been collected. Pt calls back pls advised to disregard my vm everything has been taken care of.

## 2024-04-10 NOTE — Telephone Encounter (Signed)
 [facility] pharmacy is requesting refill insulin aspart  (NOVOLOG ) 100 UNIT/ML FlexPen  Please advise

## 2024-04-10 NOTE — Telephone Encounter (Signed)
 Patient was given a paper prescription during office visit 04/09/24

## 2024-04-10 NOTE — Telephone Encounter (Signed)
 [facility] pharmacy is requesting refill eszopiclone (LUNESTA) 1 MG TABS tablet   Please advise

## 2024-04-10 NOTE — Telephone Encounter (Signed)
 Patient was given paper prescription during office visit 04/09/24

## 2024-04-15 ENCOUNTER — Ambulatory Visit: Payer: Self-pay | Admitting: Family Medicine

## 2024-04-15 DIAGNOSIS — G4709 Other insomnia: Secondary | ICD-10-CM

## 2024-04-17 ENCOUNTER — Other Ambulatory Visit: Payer: Self-pay | Admitting: Family Medicine

## 2024-04-17 DIAGNOSIS — G4709 Other insomnia: Secondary | ICD-10-CM

## 2024-04-17 NOTE — Telephone Encounter (Unsigned)
 Copied from CRM [id number]. Topic: Clinical - Medication Refill >> Apr 17, 2024  1:26 PM Alica Antu wrote: Medication: eszopiclone  (LUNESTA ) 1 MG TABS tablet per patient it needs to state 1 tablet   Has the patient contacted their pharmacy? Yes (Agent: If no, request that the patient contact the pharmacy for the refill. If patient does not wish to contact the pharmacy document the reason why and proceed with request.) (Agent: If yes, when and what did the pharmacy advise?)  This is the patient's preferred pharmacy: [id number] [place], [place] - [address] Is this the correct pharmacy for this prescription? Yes If no, delete pharmacy and type the correct one.   Has the prescription been filled recently? No  Is the patient out of the medication? No  Has the patient been seen for an appointment in the last year OR does the patient have an upcoming appointment? Yes  Can we respond through MyChart? Yes  Agent: Please be advised that Rx refills may take up to 3 business days. We ask that you follow-up with your pharmacy.

## 2024-04-18 MED ORDER — ESZOPICLONE 2 MG PO TABS: 2.0000 mg | ORAL_TABLET | Freq: Every evening | ORAL | 2 refills | Status: DC | PRN

## 2024-04-21 NOTE — Telephone Encounter (Signed)
 Requested medication (s) are due for refill today: no  Requested medication (s) are on the active medication list: yes  Last refill:  04/09/24  Future visit scheduled: no  Notes to clinic:  Unable to refill per protocol, cannot delegate. The original prescription was discontinued on 04/18/2024 by Donzella Lauraine SAILOR, DO for the following reason: Dose change      Requested Prescriptions  Pending Prescriptions Disp Refills   eszopiclone  (LUNESTA ) 1 MG TABS tablet 60 tablet 2    Sig: Take 1 tablet (1 mg total) by mouth at bedtime as needed and may repeat dose one time if needed for sleep. Take immediately before bedtime     Not Delegated - Psychiatry:  Anxiolytics/Hypnotics Failed - 04/21/2024 11:15 AM      Failed - This refill cannot be delegated      Failed - Urine Drug Screen completed in last 360 days      Failed - Valid encounter within last 6 months    Recent Outpatient Visits           1 week ago Gastroesophageal reflux disease without esophagitis   [facility], Lauraine SAILOR, DO

## 2024-04-28 ENCOUNTER — Other Ambulatory Visit: Payer: Self-pay | Admitting: Family Medicine

## 2024-04-28 DIAGNOSIS — K5900 Constipation, unspecified: Secondary | ICD-10-CM

## 2024-05-04 ENCOUNTER — Other Ambulatory Visit: Payer: Self-pay | Admitting: Family Medicine

## 2024-05-04 DIAGNOSIS — F339 Major depressive disorder, recurrent, unspecified: Secondary | ICD-10-CM

## 2024-05-17 ENCOUNTER — Other Ambulatory Visit: Payer: Self-pay | Admitting: Nurse Practitioner

## 2024-05-17 DIAGNOSIS — K219 Gastro-esophageal reflux disease without esophagitis: Secondary | ICD-10-CM

## 2024-05-17 DIAGNOSIS — I10 Essential (primary) hypertension: Secondary | ICD-10-CM

## 2024-06-12 ENCOUNTER — Other Ambulatory Visit: Payer: Self-pay | Admitting: Family Medicine

## 2024-06-12 ENCOUNTER — Telehealth: Payer: Self-pay

## 2024-06-12 ENCOUNTER — Telehealth: Admitting: Emergency Medicine

## 2024-06-12 ENCOUNTER — Ambulatory Visit

## 2024-06-12 ENCOUNTER — Other Ambulatory Visit (HOSPITAL_COMMUNITY)
Admission: RE | Admit: 2024-06-12 | Discharge: 2024-06-12 | Disposition: A | Discharge status: Home / Self Care | Source: Ambulatory Visit

## 2024-06-12 VITALS — BP 97/64 | HR 90 | Temp 98.8°F | Ht 64.0 in | Wt 200.8 lb

## 2024-06-12 DIAGNOSIS — E1165 Type 2 diabetes mellitus with hyperglycemia: Secondary | ICD-10-CM | POA: Diagnosis not present

## 2024-06-12 DIAGNOSIS — N898 Other specified noninflammatory disorders of vagina: Secondary | ICD-10-CM | POA: Insufficient documentation

## 2024-06-12 DIAGNOSIS — Z794 Long term (current) use of insulin: Secondary | ICD-10-CM | POA: Diagnosis not present

## 2024-06-12 DIAGNOSIS — R300 Dysuria: Secondary | ICD-10-CM

## 2024-06-12 DIAGNOSIS — E139 Other specified diabetes mellitus without complications: Secondary | ICD-10-CM

## 2024-06-12 LAB — POCT URINALYSIS DIPSTICK
Blood, UA: NEGATIVE
Glucose, UA: NEGATIVE
Ketones, UA: NEGATIVE
Leukocytes, UA: NEGATIVE
Nitrite, UA: NEGATIVE
Protein, UA: POSITIVE — AB
Spec Grav, UA: 1.01 (ref 1.010–1.025)
Urobilinogen, UA: 0.2 U/dL
pH, UA: 5 (ref 5.0–8.0)

## 2024-06-12 MED ORDER — TIRZEPATIDE 5 MG/0.5ML ~~LOC~~ SOAJ: 5.0000 mg | SUBCUTANEOUS | 0 refills | Status: AC

## 2024-06-12 NOTE — Telephone Encounter (Signed)
 Appt scheduled with Dr. Franchot for today at 3:40 p.m.   for UTI symptoms

## 2024-06-12 NOTE — Progress Notes (Signed)
 Acute visit   Patient: Wendy Colon   DOB: [birth year]   48 y.o. Female  MRN: [id number] PCP: Donzella Lauraine SAILOR, DO   Chief Complaint  Patient presents with   Acute Visit    Patient is here because she thinks she has a UTI, current symptoms are frequent urination, lower back pain, abdominal pain, pressure, and irritation.   Subjective     - Has been going on for 4 days - Having frequent urination - Having pressure in pelvic area - Drinks a lot of water and tea  - Reports mid-back pain, but not low back - Having vaginal discharge -whitish discharge, thick, mild odor - Denies fevers, chills - Denies rashes  Review of systems as noted in HPI.   Objective    BP 97/64 (BP Location: Left Arm, Patient Position: Sitting, Cuff Size: Normal)   Pulse 90   Temp 98.8 F (37.1 C) (Oral)   Ht 5' 4 (1.626 m)   Wt 200 lb 12.8 oz (91.1 kg)   SpO2 100%   BMI 34.47 kg/m  Physical Exam Constitutional:      Appearance: Normal appearance.  HENT:     Head: Normocephalic and atraumatic.     Mouth/Throat:     Mouth: Mucous membranes are moist.  Eyes:     Pupils: Pupils are equal, round, and reactive to light.  Pulmonary:     Effort: Pulmonary effort is normal.  Abdominal:     General: Abdomen is flat. Bowel sounds are normal. There is no distension.     Palpations: Abdomen is soft.     Tenderness: There is no abdominal tenderness. There is no right CVA tenderness, left CVA tenderness, guarding or rebound.  Skin:    General: Skin is warm.  Neurological:     General: No focal deficit present.     Mental Status: She is alert.      Wt Readings from Last 3 Encounters:  06/12/24 200 lb 12.8 oz (91.1 kg)  04/09/24 217 lb 9.6 oz (98.7 kg)  07/23/23 216 lb (98 kg)     Results for orders placed or performed in visit on 06/12/24  POCT Urinalysis Dipstick  Result Value Ref Range   Color, UA Dark Yellow    Clarity, UA Clear    Glucose, UA Negative Negative   Bilirubin, UA  Small    Ketones, UA Negative    Spec Grav, UA 1.010 1.010 - 1.025   Blood, UA Negative    pH, UA 5.0 5.0 - 8.0   Protein, UA Positive (A) Negative   Urobilinogen, UA 0.2 0.2 or 1.0 E.U./dL   Nitrite, UA Negative    Leukocytes, UA Negative Negative   Appearance     Odor      Assessment & Plan     Problem List Items Addressed This Visit       Endocrine   Hyperglycemia due to type 2 diabetes mellitus (HCC)   Uncontrolled based on last A1c. Has been taking Mounjaro  as prescribed and has lost 17 pounds since last visit! Needs refill for 5mg  dose. - Refill provided - Follow up with PCP  Lab Results  Component Value Date   HGBA1C 8.8 (H) 04/09/2024         Relevant Medications   tirzepatide  (MOUNJARO ) 5 MG/0.5ML Pen     Other   Dysuria - Primary   Patient with 4 day hx of dysuria and frequent urination. No systemic symptoms at this time.  Urine dip negative for UTI. Will send for UA and culture and follow up results with patient.      Relevant Orders   Urine Culture   Urinalysis, Routine w reflex microscopic   POCT Urinalysis Dipstick (Completed)   Vaginal discharge   Patient also with 4 day hx of vaginal discharge and pelvic pressure. DDX includes BV, yeast infection, trich, G/C. Will obtain wet prep and follow up results with patient.      Relevant Orders   Cervicovaginal ancillary only   Meds ordered this encounter  Medications   tirzepatide  (MOUNJARO ) 5 MG/0.5ML Pen    Sig: Inject 5 mg into the skin once a week.    Dispense:  6 mL    Refill:  0     No follow-ups on file.      Isaiah DELENA Pepper, MD  [facility] [phone] (phone) [phone] (fax)

## 2024-06-12 NOTE — Assessment & Plan Note (Addendum)
 Uncontrolled based on last A1c. Has been taking Mounjaro  as prescribed and has lost 17 pounds since last visit! Needs refill for 5mg  dose. - Refill provided - Follow up with PCP  Lab Results  Component Value Date   HGBA1C 8.8 (H) 04/09/2024

## 2024-06-12 NOTE — Assessment & Plan Note (Signed)
 Patient with 4 day hx of dysuria and frequent urination. No systemic symptoms at this time. Urine dip negative for UTI. Will send for UA and culture and follow up results with patient.

## 2024-06-12 NOTE — Telephone Encounter (Signed)
Refills should be at pharmacy

## 2024-06-12 NOTE — Assessment & Plan Note (Addendum)
 Patient also with 4 day hx of vaginal discharge and pelvic pressure. DDX includes BV, yeast infection, trich, G/C. Will obtain wet prep and follow up results with patient.

## 2024-06-12 NOTE — Telephone Encounter (Signed)
 Copied from CRM #[id number]. Topic: Clinical - Medical Advice >> Jun 12, 2024 10:56 AM Leonette SQUIBB wrote: Reason for CRM: pt called asking if Dr. Greggory nurse will give her a call.  She is trying to see her but there are no appts until the 3rd of Sept

## 2024-06-12 NOTE — Progress Notes (Signed)
  Because you have back pain, belly pain, and very dark urine, I feel your condition warrants further evaluation and I recommend that you be seen in a face-to-face visit. I think you need urine testing and vaginal discharge testing. I am sorry I am not able to help you by evisit.    NOTE: There will be NO CHARGE for this E-Visit   If you are having a true medical emergency, please call 911.     For an urgent face to face visit, [facility] has multiple urgent care centers for your convenience.  Click the link below for the full list of locations and hours, walk-in wait times, appointment scheduling options and driving directions:  Urgent Care - [place], [place], [place], [place], [place], [place]  [place]     Your MyChart E-visit questionnaire answers were reviewed by a board certified advanced clinical practitioner to complete your personal care plan based on your specific symptoms.    Thank you for using e-Visits.

## 2024-06-13 LAB — URINALYSIS, ROUTINE W REFLEX MICROSCOPIC

## 2024-06-13 LAB — SPECIMEN STATUS REPORT

## 2024-06-14 LAB — URINE CULTURE

## 2024-06-14 LAB — SPECIMEN STATUS REPORT

## 2024-06-16 ENCOUNTER — Ambulatory Visit: Payer: Self-pay

## 2024-06-16 ENCOUNTER — Other Ambulatory Visit: Payer: Self-pay

## 2024-06-16 DIAGNOSIS — B9689 Other specified bacterial agents as the cause of diseases classified elsewhere: Secondary | ICD-10-CM

## 2024-06-16 LAB — CERVICOVAGINAL ANCILLARY ONLY
Bacterial Vaginitis (gardnerella): POSITIVE — AB
Candida Glabrata: NEGATIVE
Candida Vaginitis: NEGATIVE
Chlamydia: NEGATIVE
Comment: NEGATIVE
Comment: NEGATIVE
Comment: NEGATIVE
Comment: NEGATIVE
Comment: NEGATIVE
Comment: NORMAL
Neisseria Gonorrhea: NEGATIVE
Trichomonas: NEGATIVE

## 2024-06-16 MED ORDER — METRONIDAZOLE 500 MG PO TABS: 500.0000 mg | ORAL_TABLET | Freq: Two times a day (BID) | ORAL | 0 refills | Status: AC

## 2024-06-18 DIAGNOSIS — Z124 Encounter for screening for malignant neoplasm of cervix: Secondary | ICD-10-CM | POA: Diagnosis not present

## 2024-06-18 DIAGNOSIS — Z0001 Encounter for general adult medical examination with abnormal findings: Secondary | ICD-10-CM | POA: Diagnosis not present

## 2024-06-18 DIAGNOSIS — N952 Postmenopausal atrophic vaginitis: Secondary | ICD-10-CM | POA: Diagnosis not present

## 2024-06-18 DIAGNOSIS — N760 Acute vaginitis: Secondary | ICD-10-CM | POA: Diagnosis not present

## 2024-06-18 DIAGNOSIS — N920 Excessive and frequent menstruation with regular cycle: Secondary | ICD-10-CM | POA: Diagnosis not present

## 2024-06-19 ENCOUNTER — Telehealth: Payer: Self-pay

## 2024-06-19 ENCOUNTER — Other Ambulatory Visit (HOSPITAL_COMMUNITY): Payer: Self-pay

## 2024-06-19 NOTE — Telephone Encounter (Signed)
 Pharmacy Patient Advocate Encounter   Received notification from Onbase that prior authorization for Mounjaro  5mg /0.52ml injectable pen is required/requested.   Insurance verification completed.   The patient is insured through [facility] . Pt also has [employer] medicaid   Per test claim: The current 28 day co-pay is, $125.00 without manufacturer coupon.  No PA needed at this time. This test claim was processed through [facility]- copay amounts may vary at other pharmacies due to pharmacy/plan contracts, or as the patient moves through the different stages of their insurance plan.

## 2024-08-08 ENCOUNTER — Other Ambulatory Visit: Payer: Self-pay | Admitting: Family Medicine

## 2024-08-08 ENCOUNTER — Other Ambulatory Visit (HOSPITAL_COMMUNITY): Payer: Self-pay

## 2024-08-08 ENCOUNTER — Telehealth: Payer: Self-pay

## 2024-08-08 ENCOUNTER — Other Ambulatory Visit: Payer: Self-pay

## 2024-08-08 DIAGNOSIS — E1165 Type 2 diabetes mellitus with hyperglycemia: Secondary | ICD-10-CM

## 2024-08-08 DIAGNOSIS — E139 Other specified diabetes mellitus without complications: Secondary | ICD-10-CM

## 2024-08-08 DIAGNOSIS — G4709 Other insomnia: Secondary | ICD-10-CM

## 2024-08-08 NOTE — Telephone Encounter (Signed)
 Pharmacy Patient Advocate Encounter   Received notification from Onbase that prior authorization for Mounjaro  5MG /0.5ML auto-injectors  is required/requested.   Insurance verification completed.   The patient is insured through [employer].   Per test claim: PA required; PA submitted to above mentioned insurance via Latent Key/confirmation #/EOC [id number] Status is pending

## 2024-08-08 NOTE — Telephone Encounter (Signed)
 Pharmacy Patient Advocate Encounter  Received notification from [employer] that Prior Authorization for Mounjaro  5MG /0.5ML auto-injectors has been DENIED.  DENIAL LETTER HAS BEEN UPLOADED TO HER MEDIA TAB   PA #/Case ID/Reference #: [id number]

## 2024-08-11 NOTE — Telephone Encounter (Signed)
 LOV- 04/18/2024 NOV- None LRF- 04/18/2024 30 x 2

## 2024-08-12 ENCOUNTER — Other Ambulatory Visit: Payer: Self-pay | Admitting: Family Medicine

## 2024-09-15 ENCOUNTER — Other Ambulatory Visit (HOSPITAL_COMMUNITY): Payer: Self-pay

## 2024-09-17 ENCOUNTER — Other Ambulatory Visit: Payer: Self-pay | Admitting: Family Medicine

## 2024-09-17 DIAGNOSIS — E1165 Type 2 diabetes mellitus with hyperglycemia: Secondary | ICD-10-CM

## 2024-09-17 DIAGNOSIS — I10 Essential (primary) hypertension: Secondary | ICD-10-CM

## 2024-09-18 NOTE — Telephone Encounter (Signed)
 Was filled on 08/08/2024 by Dr. Gasper as a 90 day supply   Outpatient Medication Detail   Disp Refills Start End   glimepiride  (AMARYL ) 2 MG tablet 180 tablet 0 08/08/2024 --   Sig: TAKE 1 TABLET(2 MG) BY MOUTH TWICE DAILY   Sent to pharmacy as: glimepiride  (AMARYL ) 2 MG tablet   E-Prescribing Status: Receipt confirmed by pharmacy (08/08/2024  2:40 PM EDT)   Renewals  Renewal provider: Gasper Nancyann BRAVO, MD

## 2024-10-03 NOTE — Telephone Encounter (Signed)
 Requested medication (s) are due for refill today: yes   Requested medication (s) are on the active medication list: yes   Last refill:  05/17/24 #90 1 refills  Future visit scheduled: no   Notes to clinic:  last ordered by Juliette Roys, FNP. Do you want to order/ refill Rx?     Requested Prescriptions  Pending Prescriptions Disp Refills   losartan  (COZAAR ) 25 MG tablet [Pharmacy Med Name: LOSARTAN  25MG  TABLETS] 90 tablet 1    Sig: TAKE 1 TABLET(25 MG) BY MOUTH DAILY     Cardiovascular:  Angiotensin Receptor Blockers Passed - 10/03/2024  3:59 PM      Passed - Cr in normal range and within 180 days    Creatinine, Ser  Date Value Ref Range Status  04/09/2024 0.91 0.57 - 1.00 mg/dL Final         Passed - K in normal range and within 180 days    Potassium  Date Value Ref Range Status  04/09/2024 4.7 3.5 - 5.2 mmol/L Final         Passed - Patient is not pregnant      Passed - Last BP in normal range    BP Readings from Last 1 Encounters:  06/12/24 97/64         Passed - Valid encounter within last 6 months    Recent Outpatient Visits           3 months ago Dysuria   [facility] Franchot Isaiah LABOR, MD   5 months ago Gastroesophageal reflux disease without esophagitis   [facility] [place], Lauraine SAILOR, DO
